# Patient Record
Sex: Female | Born: 1935 | Race: White | Hispanic: No | Marital: Married | Smoking: Never smoker
Health system: Southern US, Community
[De-identification: ages and names within clinical notes are randomized; demographics above are authoritative.]

## PROBLEM LIST (undated history)

## (undated) DIAGNOSIS — R296 Repeated falls: Secondary | ICD-10-CM

## (undated) DIAGNOSIS — G4730 Sleep apnea, unspecified: Secondary | ICD-10-CM

## (undated) DIAGNOSIS — I509 Heart failure, unspecified: Secondary | ICD-10-CM

## (undated) DIAGNOSIS — M810 Age-related osteoporosis without current pathological fracture: Secondary | ICD-10-CM

## (undated) DIAGNOSIS — I10 Essential (primary) hypertension: Secondary | ICD-10-CM

## (undated) DIAGNOSIS — T7840XA Allergy, unspecified, initial encounter: Secondary | ICD-10-CM

## (undated) DIAGNOSIS — E785 Hyperlipidemia, unspecified: Secondary | ICD-10-CM

## (undated) DIAGNOSIS — W19XXXA Unspecified fall, initial encounter: Secondary | ICD-10-CM

## (undated) DIAGNOSIS — E079 Disorder of thyroid, unspecified: Secondary | ICD-10-CM

## (undated) HISTORY — DX: Disorder of thyroid, unspecified: E07.9

## (undated) HISTORY — DX: Repeated falls: R29.6

## (undated) HISTORY — DX: Heart failure, unspecified: I50.9

## (undated) HISTORY — DX: Essential (primary) hypertension: I10

## (undated) HISTORY — DX: Allergy, unspecified, initial encounter: T78.40XA

## (undated) HISTORY — DX: Age-related osteoporosis without current pathological fracture: M81.0

## (undated) HISTORY — PX: JOINT REPLACEMENT: SHX530

## (undated) HISTORY — DX: Unspecified fall, initial encounter: W19.XXXA

## (undated) HISTORY — PX: CATARACT EXTRACTION W/ INTRAOCULAR LENS  IMPLANT, BILATERAL: SHX1307

## (undated) HISTORY — PX: HEMORRHOID SURGERY: SHX153

## (undated) HISTORY — DX: Hyperlipidemia, unspecified: E78.5

## (undated) HISTORY — PX: REPLACEMENT TOTAL KNEE: SUR1224

## (undated) HISTORY — PX: CHOLECYSTECTOMY: SHX55

## (undated) HISTORY — PX: APPENDECTOMY: SHX54

## (undated) HISTORY — DX: Sleep apnea, unspecified: G47.30

---

## 2004-01-15 ENCOUNTER — Ambulatory Visit: Payer: Self-pay | Admitting: Pain Medicine

## 2004-01-21 ENCOUNTER — Ambulatory Visit: Payer: Self-pay | Admitting: Pain Medicine

## 2004-02-28 ENCOUNTER — Ambulatory Visit: Payer: Self-pay | Admitting: Pain Medicine

## 2004-03-10 ENCOUNTER — Ambulatory Visit: Payer: Self-pay | Admitting: Pain Medicine

## 2004-04-10 ENCOUNTER — Ambulatory Visit: Payer: Self-pay | Admitting: Pain Medicine

## 2004-04-21 ENCOUNTER — Ambulatory Visit: Payer: Self-pay | Admitting: Pain Medicine

## 2004-05-27 ENCOUNTER — Ambulatory Visit: Payer: Self-pay | Admitting: Pain Medicine

## 2004-06-02 ENCOUNTER — Ambulatory Visit: Payer: Self-pay | Admitting: Pain Medicine

## 2004-07-01 ENCOUNTER — Ambulatory Visit: Payer: Self-pay | Admitting: Pain Medicine

## 2004-07-16 ENCOUNTER — Ambulatory Visit: Payer: Self-pay | Admitting: Pain Medicine

## 2004-08-26 ENCOUNTER — Ambulatory Visit: Payer: Self-pay | Admitting: Pain Medicine

## 2004-09-08 ENCOUNTER — Ambulatory Visit: Payer: Self-pay | Admitting: Pain Medicine

## 2004-10-21 ENCOUNTER — Ambulatory Visit: Payer: Self-pay | Admitting: Pain Medicine

## 2004-11-11 ENCOUNTER — Ambulatory Visit: Payer: Self-pay | Admitting: Pain Medicine

## 2005-01-05 ENCOUNTER — Ambulatory Visit: Payer: Self-pay | Admitting: Internal Medicine

## 2006-02-09 ENCOUNTER — Ambulatory Visit: Payer: Self-pay | Admitting: Internal Medicine

## 2006-03-04 ENCOUNTER — Ambulatory Visit: Payer: Self-pay | Admitting: Pain Medicine

## 2006-03-08 ENCOUNTER — Ambulatory Visit: Payer: Self-pay | Admitting: Pain Medicine

## 2006-04-22 ENCOUNTER — Ambulatory Visit: Payer: Self-pay | Admitting: Pain Medicine

## 2006-05-03 ENCOUNTER — Ambulatory Visit: Payer: Self-pay | Admitting: Pain Medicine

## 2006-06-09 ENCOUNTER — Ambulatory Visit: Payer: Self-pay | Admitting: Internal Medicine

## 2006-06-15 ENCOUNTER — Ambulatory Visit: Payer: Self-pay | Admitting: Pain Medicine

## 2006-06-21 ENCOUNTER — Ambulatory Visit: Payer: Self-pay | Admitting: Pain Medicine

## 2006-07-13 ENCOUNTER — Ambulatory Visit: Payer: Self-pay | Admitting: Pain Medicine

## 2006-07-14 ENCOUNTER — Ambulatory Visit: Payer: Self-pay | Admitting: Specialist

## 2006-07-19 ENCOUNTER — Ambulatory Visit: Payer: Self-pay | Admitting: Pain Medicine

## 2006-08-10 ENCOUNTER — Ambulatory Visit: Payer: Self-pay | Admitting: Pain Medicine

## 2006-08-11 ENCOUNTER — Ambulatory Visit: Payer: Self-pay | Admitting: Specialist

## 2006-09-07 ENCOUNTER — Ambulatory Visit: Payer: Self-pay | Admitting: Pain Medicine

## 2006-09-13 ENCOUNTER — Ambulatory Visit: Payer: Self-pay | Admitting: Pain Medicine

## 2006-10-04 ENCOUNTER — Ambulatory Visit: Payer: Self-pay | Admitting: Pain Medicine

## 2006-10-12 ENCOUNTER — Ambulatory Visit: Payer: Self-pay | Admitting: Pain Medicine

## 2006-10-18 ENCOUNTER — Ambulatory Visit: Payer: Self-pay | Admitting: Pain Medicine

## 2006-11-09 ENCOUNTER — Ambulatory Visit: Payer: Self-pay | Admitting: Pain Medicine

## 2006-11-15 ENCOUNTER — Ambulatory Visit: Payer: Self-pay | Admitting: Pain Medicine

## 2006-12-14 ENCOUNTER — Ambulatory Visit: Payer: Self-pay | Admitting: Pain Medicine

## 2007-01-25 ENCOUNTER — Ambulatory Visit: Payer: Self-pay | Admitting: Pain Medicine

## 2007-02-02 ENCOUNTER — Ambulatory Visit: Payer: Self-pay | Admitting: Pain Medicine

## 2007-02-07 ENCOUNTER — Ambulatory Visit: Payer: Self-pay | Admitting: Pain Medicine

## 2007-05-03 ENCOUNTER — Ambulatory Visit: Payer: Self-pay | Admitting: Gastroenterology

## 2007-07-27 ENCOUNTER — Ambulatory Visit: Payer: Self-pay | Admitting: Pain Medicine

## 2007-08-30 ENCOUNTER — Ambulatory Visit: Payer: Self-pay | Admitting: Pain Medicine

## 2007-10-11 ENCOUNTER — Ambulatory Visit: Payer: Self-pay | Admitting: Pain Medicine

## 2007-11-08 ENCOUNTER — Ambulatory Visit: Payer: Self-pay | Admitting: Pain Medicine

## 2007-12-06 ENCOUNTER — Ambulatory Visit: Payer: Self-pay | Admitting: Pain Medicine

## 2008-01-10 ENCOUNTER — Ambulatory Visit: Payer: Self-pay | Admitting: Pain Medicine

## 2008-02-21 ENCOUNTER — Ambulatory Visit: Payer: Self-pay | Admitting: Pain Medicine

## 2008-02-27 ENCOUNTER — Ambulatory Visit: Payer: Self-pay | Admitting: Pain Medicine

## 2008-03-20 ENCOUNTER — Ambulatory Visit: Payer: Self-pay | Admitting: Pain Medicine

## 2008-03-28 ENCOUNTER — Ambulatory Visit: Payer: Self-pay | Admitting: Pain Medicine

## 2008-04-10 ENCOUNTER — Ambulatory Visit: Payer: Self-pay | Admitting: Pain Medicine

## 2008-05-08 ENCOUNTER — Ambulatory Visit: Payer: Self-pay | Admitting: Pain Medicine

## 2008-05-14 ENCOUNTER — Ambulatory Visit: Payer: Self-pay | Admitting: Pain Medicine

## 2008-06-05 ENCOUNTER — Ambulatory Visit: Payer: Self-pay | Admitting: Pain Medicine

## 2008-06-11 ENCOUNTER — Ambulatory Visit: Payer: Self-pay | Admitting: Pain Medicine

## 2008-06-21 ENCOUNTER — Ambulatory Visit: Payer: Self-pay

## 2008-07-05 ENCOUNTER — Ambulatory Visit: Payer: Self-pay | Admitting: Pain Medicine

## 2008-07-30 ENCOUNTER — Ambulatory Visit: Payer: Self-pay | Admitting: Pain Medicine

## 2008-08-15 ENCOUNTER — Encounter: Payer: Self-pay | Admitting: Pain Medicine

## 2008-09-04 ENCOUNTER — Ambulatory Visit: Payer: Self-pay | Admitting: Pain Medicine

## 2008-10-02 ENCOUNTER — Ambulatory Visit: Payer: Self-pay | Admitting: Pain Medicine

## 2008-10-24 ENCOUNTER — Ambulatory Visit: Payer: Self-pay | Admitting: Pain Medicine

## 2008-11-22 ENCOUNTER — Ambulatory Visit: Payer: Self-pay | Admitting: Pain Medicine

## 2008-12-19 ENCOUNTER — Ambulatory Visit: Payer: Self-pay | Admitting: Ophthalmology

## 2008-12-25 ENCOUNTER — Ambulatory Visit: Payer: Self-pay | Admitting: Pain Medicine

## 2009-01-01 ENCOUNTER — Ambulatory Visit: Payer: Self-pay | Admitting: Ophthalmology

## 2009-01-29 ENCOUNTER — Ambulatory Visit: Payer: Self-pay | Admitting: Pain Medicine

## 2009-01-29 ENCOUNTER — Ambulatory Visit: Payer: Self-pay | Admitting: Ophthalmology

## 2009-02-12 ENCOUNTER — Ambulatory Visit: Payer: Self-pay | Admitting: Ophthalmology

## 2009-02-28 ENCOUNTER — Ambulatory Visit: Payer: Self-pay | Admitting: Pain Medicine

## 2009-03-14 ENCOUNTER — Ambulatory Visit: Payer: Self-pay | Admitting: Pain Medicine

## 2009-03-28 ENCOUNTER — Ambulatory Visit: Payer: Self-pay | Admitting: Pain Medicine

## 2009-04-25 ENCOUNTER — Ambulatory Visit: Payer: Self-pay | Admitting: Pain Medicine

## 2009-05-23 ENCOUNTER — Ambulatory Visit: Payer: Self-pay | Admitting: Pain Medicine

## 2009-06-20 ENCOUNTER — Ambulatory Visit: Payer: Self-pay | Admitting: Pain Medicine

## 2009-07-16 ENCOUNTER — Ambulatory Visit: Payer: Self-pay | Admitting: Unknown Physician Specialty

## 2009-07-18 ENCOUNTER — Ambulatory Visit: Payer: Self-pay | Admitting: Pain Medicine

## 2009-07-23 ENCOUNTER — Inpatient Hospital Stay: Payer: Self-pay | Admitting: Unknown Physician Specialty

## 2009-07-27 ENCOUNTER — Encounter: Payer: Self-pay | Admitting: Internal Medicine

## 2009-09-03 ENCOUNTER — Inpatient Hospital Stay: Payer: Self-pay | Admitting: Internal Medicine

## 2009-09-30 ENCOUNTER — Ambulatory Visit: Payer: Self-pay | Admitting: Internal Medicine

## 2009-10-08 ENCOUNTER — Ambulatory Visit: Payer: Self-pay | Admitting: Pain Medicine

## 2009-10-14 ENCOUNTER — Ambulatory Visit: Payer: Self-pay | Admitting: Pain Medicine

## 2009-11-07 ENCOUNTER — Ambulatory Visit: Payer: Self-pay | Admitting: Pain Medicine

## 2009-11-11 ENCOUNTER — Ambulatory Visit: Payer: Self-pay | Admitting: Pain Medicine

## 2009-12-03 ENCOUNTER — Ambulatory Visit: Payer: Self-pay | Admitting: Pain Medicine

## 2010-01-07 ENCOUNTER — Ambulatory Visit: Payer: Self-pay | Admitting: Pain Medicine

## 2010-02-04 ENCOUNTER — Ambulatory Visit: Payer: Self-pay | Admitting: Pain Medicine

## 2010-03-04 ENCOUNTER — Ambulatory Visit: Payer: Self-pay | Admitting: Pain Medicine

## 2010-03-10 ENCOUNTER — Ambulatory Visit: Payer: Self-pay | Admitting: Pain Medicine

## 2010-04-09 ENCOUNTER — Ambulatory Visit: Payer: Self-pay | Admitting: Pain Medicine

## 2010-05-20 ENCOUNTER — Ambulatory Visit: Payer: Self-pay | Admitting: Pain Medicine

## 2010-06-04 ENCOUNTER — Ambulatory Visit: Payer: Self-pay | Admitting: Pain Medicine

## 2010-07-08 ENCOUNTER — Ambulatory Visit: Payer: Self-pay | Admitting: Pain Medicine

## 2010-07-30 ENCOUNTER — Ambulatory Visit: Payer: Self-pay | Admitting: Pain Medicine

## 2010-09-02 ENCOUNTER — Ambulatory Visit: Payer: Self-pay | Admitting: Pain Medicine

## 2010-09-30 ENCOUNTER — Ambulatory Visit: Payer: Self-pay | Admitting: Pain Medicine

## 2010-10-01 ENCOUNTER — Ambulatory Visit: Payer: Self-pay | Admitting: Pain Medicine

## 2010-11-04 ENCOUNTER — Ambulatory Visit: Payer: Self-pay | Admitting: Pain Medicine

## 2010-12-02 ENCOUNTER — Ambulatory Visit: Payer: Self-pay | Admitting: Pain Medicine

## 2010-12-30 ENCOUNTER — Ambulatory Visit: Payer: Self-pay | Admitting: Pain Medicine

## 2011-01-27 ENCOUNTER — Ambulatory Visit: Payer: Self-pay | Admitting: Pain Medicine

## 2011-03-03 ENCOUNTER — Ambulatory Visit: Payer: Self-pay | Admitting: Pain Medicine

## 2011-03-09 ENCOUNTER — Ambulatory Visit: Payer: Self-pay | Admitting: Pain Medicine

## 2011-04-01 ENCOUNTER — Ambulatory Visit: Payer: Self-pay | Admitting: Pain Medicine

## 2011-05-11 ENCOUNTER — Ambulatory Visit: Payer: Self-pay | Admitting: Pain Medicine

## 2011-05-13 ENCOUNTER — Ambulatory Visit: Payer: Self-pay | Admitting: Pain Medicine

## 2011-06-09 ENCOUNTER — Ambulatory Visit: Payer: Self-pay | Admitting: Pain Medicine

## 2011-06-17 ENCOUNTER — Ambulatory Visit: Payer: Self-pay | Admitting: Pain Medicine

## 2011-07-08 ENCOUNTER — Ambulatory Visit: Payer: Self-pay | Admitting: Pain Medicine

## 2011-08-11 ENCOUNTER — Ambulatory Visit: Payer: Self-pay | Admitting: Pain Medicine

## 2011-09-08 ENCOUNTER — Ambulatory Visit: Payer: Self-pay | Admitting: Pain Medicine

## 2011-10-13 ENCOUNTER — Ambulatory Visit: Payer: Self-pay | Admitting: Pain Medicine

## 2011-10-21 ENCOUNTER — Ambulatory Visit: Payer: Self-pay | Admitting: Pain Medicine

## 2011-11-10 ENCOUNTER — Ambulatory Visit: Payer: Self-pay | Admitting: Pain Medicine

## 2011-12-08 ENCOUNTER — Ambulatory Visit: Payer: Self-pay | Admitting: Cardiology

## 2011-12-10 ENCOUNTER — Ambulatory Visit: Payer: Self-pay | Admitting: Pain Medicine

## 2011-12-23 ENCOUNTER — Emergency Department: Payer: Self-pay | Admitting: Emergency Medicine

## 2011-12-23 LAB — CBC
HGB: 12.3 g/dL (ref 12.0–16.0)
MCH: 31.2 pg (ref 26.0–34.0)
MCHC: 34.8 g/dL (ref 32.0–36.0)
MCV: 90 fL (ref 80–100)
RBC: 3.94 10*6/uL (ref 3.80–5.20)
RDW: 13.9 % (ref 11.5–14.5)

## 2011-12-23 LAB — BASIC METABOLIC PANEL
Anion Gap: 10 (ref 7–16)
Calcium, Total: 8.9 mg/dL (ref 8.5–10.1)
Co2: 26 mmol/L (ref 21–32)
EGFR (Non-African Amer.): 39 — ABNORMAL LOW
Glucose: 112 mg/dL — ABNORMAL HIGH (ref 65–99)
Potassium: 3.8 mmol/L (ref 3.5–5.1)
Sodium: 143 mmol/L (ref 136–145)

## 2011-12-24 LAB — URINALYSIS, COMPLETE
Blood: NEGATIVE
Leukocyte Esterase: NEGATIVE
Nitrite: NEGATIVE
Ph: 6 (ref 4.5–8.0)
Squamous Epithelial: 10

## 2012-01-12 ENCOUNTER — Ambulatory Visit: Payer: Self-pay | Admitting: Pain Medicine

## 2012-01-20 ENCOUNTER — Ambulatory Visit: Payer: Self-pay | Admitting: Pain Medicine

## 2012-03-29 ENCOUNTER — Ambulatory Visit: Payer: Self-pay | Admitting: Pain Medicine

## 2012-05-03 ENCOUNTER — Ambulatory Visit: Payer: Self-pay | Admitting: Pain Medicine

## 2012-05-11 ENCOUNTER — Ambulatory Visit: Payer: Self-pay | Admitting: Pain Medicine

## 2012-05-31 ENCOUNTER — Ambulatory Visit: Payer: Self-pay | Admitting: Pain Medicine

## 2012-06-13 ENCOUNTER — Ambulatory Visit: Payer: Self-pay | Admitting: Pain Medicine

## 2012-06-28 ENCOUNTER — Ambulatory Visit: Payer: Self-pay | Admitting: Pain Medicine

## 2012-07-13 ENCOUNTER — Ambulatory Visit: Payer: Self-pay | Admitting: Pain Medicine

## 2012-08-09 ENCOUNTER — Ambulatory Visit: Payer: Self-pay | Admitting: Pain Medicine

## 2012-08-29 ENCOUNTER — Ambulatory Visit: Payer: Self-pay | Admitting: Pain Medicine

## 2012-09-06 ENCOUNTER — Ambulatory Visit: Payer: Self-pay | Admitting: Pain Medicine

## 2012-10-04 ENCOUNTER — Ambulatory Visit: Payer: Self-pay | Admitting: Pain Medicine

## 2012-11-01 ENCOUNTER — Ambulatory Visit: Payer: Self-pay | Admitting: Pain Medicine

## 2012-12-06 ENCOUNTER — Ambulatory Visit: Payer: Self-pay | Admitting: Pain Medicine

## 2013-01-03 ENCOUNTER — Ambulatory Visit: Payer: Self-pay | Admitting: Pain Medicine

## 2013-01-31 ENCOUNTER — Ambulatory Visit: Payer: Self-pay | Admitting: Pain Medicine

## 2013-03-07 ENCOUNTER — Ambulatory Visit: Payer: Self-pay | Admitting: Pain Medicine

## 2013-04-04 ENCOUNTER — Ambulatory Visit: Payer: Self-pay | Admitting: Pain Medicine

## 2013-05-02 ENCOUNTER — Ambulatory Visit: Payer: Self-pay | Admitting: Pain Medicine

## 2013-06-06 ENCOUNTER — Ambulatory Visit: Payer: Self-pay | Admitting: Pain Medicine

## 2013-07-04 ENCOUNTER — Ambulatory Visit: Payer: Self-pay | Admitting: Pain Medicine

## 2013-08-08 ENCOUNTER — Ambulatory Visit: Payer: Self-pay | Admitting: Pain Medicine

## 2013-09-12 ENCOUNTER — Ambulatory Visit: Payer: Self-pay | Admitting: Pain Medicine

## 2013-10-10 ENCOUNTER — Ambulatory Visit: Payer: Self-pay | Admitting: Pain Medicine

## 2013-11-07 ENCOUNTER — Ambulatory Visit: Payer: Self-pay | Admitting: Pain Medicine

## 2013-12-05 ENCOUNTER — Ambulatory Visit: Payer: Self-pay | Admitting: Pain Medicine

## 2014-01-02 ENCOUNTER — Ambulatory Visit: Payer: Self-pay | Admitting: Pain Medicine

## 2014-02-06 ENCOUNTER — Ambulatory Visit: Payer: Self-pay | Admitting: Pain Medicine

## 2014-03-06 ENCOUNTER — Ambulatory Visit: Payer: Self-pay | Admitting: Pain Medicine

## 2014-04-03 ENCOUNTER — Ambulatory Visit: Payer: Self-pay | Admitting: Pain Medicine

## 2014-05-08 ENCOUNTER — Ambulatory Visit: Payer: Self-pay | Admitting: Pain Medicine

## 2014-06-05 ENCOUNTER — Ambulatory Visit: Payer: Self-pay | Admitting: Pain Medicine

## 2014-07-10 ENCOUNTER — Ambulatory Visit
Admit: 2014-07-10 | Disposition: A | Discharge status: Home / Self Care | Payer: Self-pay | Attending: Pain Medicine | Admitting: Pain Medicine

## 2014-07-10 ENCOUNTER — Ambulatory Visit
Admit: 2014-07-10 | Disposition: A | Discharge status: Home / Self Care | Payer: Self-pay | Attending: Internal Medicine | Admitting: Internal Medicine

## 2014-08-07 ENCOUNTER — Ambulatory Visit: Payer: Medicare Other | Attending: Pain Medicine | Admitting: Pain Medicine

## 2014-08-07 ENCOUNTER — Encounter (INDEPENDENT_AMBULATORY_CARE_PROVIDER_SITE_OTHER): Payer: Self-pay

## 2014-08-07 ENCOUNTER — Encounter: Payer: Self-pay | Admitting: Pain Medicine

## 2014-08-07 VITALS — BP 151/69 | HR 71 | Temp 98.6°F | Resp 16 | Ht 61.0 in | Wt 213.0 lb

## 2014-08-07 DIAGNOSIS — M5030 Other cervical disc degeneration, unspecified cervical region: Secondary | ICD-10-CM

## 2014-08-07 DIAGNOSIS — M5136 Other intervertebral disc degeneration, lumbar region: Secondary | ICD-10-CM | POA: Diagnosis not present

## 2014-08-07 DIAGNOSIS — M25512 Pain in left shoulder: Secondary | ICD-10-CM | POA: Diagnosis present

## 2014-08-07 DIAGNOSIS — I8390 Asymptomatic varicose veins of unspecified lower extremity: Secondary | ICD-10-CM | POA: Insufficient documentation

## 2014-08-07 DIAGNOSIS — G5600 Carpal tunnel syndrome, unspecified upper limb: Secondary | ICD-10-CM | POA: Insufficient documentation

## 2014-08-07 DIAGNOSIS — M7061 Trochanteric bursitis, right hip: Secondary | ICD-10-CM

## 2014-08-07 DIAGNOSIS — G5603 Carpal tunnel syndrome, bilateral upper limbs: Secondary | ICD-10-CM

## 2014-08-07 DIAGNOSIS — M5126 Other intervertebral disc displacement, lumbar region: Secondary | ICD-10-CM | POA: Diagnosis not present

## 2014-08-07 DIAGNOSIS — M25511 Pain in right shoulder: Secondary | ICD-10-CM | POA: Diagnosis present

## 2014-08-07 DIAGNOSIS — M7060 Trochanteric bursitis, unspecified hip: Secondary | ICD-10-CM | POA: Insufficient documentation

## 2014-08-07 DIAGNOSIS — M7062 Trochanteric bursitis, left hip: Secondary | ICD-10-CM

## 2014-08-07 DIAGNOSIS — M170 Bilateral primary osteoarthritis of knee: Secondary | ICD-10-CM

## 2014-08-07 DIAGNOSIS — M5481 Occipital neuralgia: Secondary | ICD-10-CM | POA: Diagnosis not present

## 2014-08-07 DIAGNOSIS — M4806 Spinal stenosis, lumbar region: Secondary | ICD-10-CM | POA: Insufficient documentation

## 2014-08-07 MED ORDER — TRAMADOL HCL 50 MG PO TABS: ORAL_TABLET | ORAL | Status: DC

## 2014-08-07 MED ORDER — HYDROCODONE-ACETAMINOPHEN 5-325 MG PO TABS: ORAL_TABLET | ORAL | Status: DC

## 2014-08-07 NOTE — Progress Notes (Signed)
   Subjective:    Patient ID: Marisa SaversGrace R Randal, female    DOB: Nov 07, 1923, 81 y.o.   MRN: [id number]  HPI    Review of Systems     Objective:   Physical Exam        Assessment & Plan:

## 2014-08-07 NOTE — Progress Notes (Signed)
Discharged at 1315, ambulatory.

## 2014-08-07 NOTE — Patient Instructions (Signed)
Continue present medications. Tramadol and hydrocodone acetaminophen  F/U PCP for evaliation of  BP and general medical  condition.  F/U surgical evaluation.  F/U neurological evaluation.  May consider radiofrequency rhizolysis or intraspinal procedures pending response to present treatment and F/U evaluation.  [facility] to call [facility] should patient have concerns prior to scheduled return appointment.

## 2014-08-07 NOTE — Progress Notes (Signed)
   Subjective:    Patient ID: Marisa [name], female    DOB: January 23, 1924, 81 y.o.   MRN: [id number]  HPI  Patient is a 81 year old female [facility] for further evaluation and treatment of pain involving multiple regions patient is to pain involving the shoulders Marisa [name] the right shoulder lower back lower extremity regions as well. Stressed interventional treatment which we will avoid and will continue medications as discussed with patient. We will consider modification of treatment pending follow-up evaluation patient denies trauma change in events of daily living of significant degree. We attributed the increased pain of multiple regions to the cold damp weather at this time.    Review of Systems     Objective:   Physical Exam  There was tenderness of the splenius capitis muscles and is occipitalis muscles of moderate degree and palpation of the acromioclavicular glenohumeral joint regions reproduced mild discomfort grip strength decreased on the left compared to the right with Tinel and Phalen's maneuver increasing pain on the left greater than the right. Patient of the thoracic facet thoracic paraspinal musculature region with tinged palpation with moderate muscle spasms without crepitus of the thoracic region noted. Region of the lumbar paraspinal muscle lumbar facet region reproduces moderately severe discomfort left greater than right with severe tenderness of the gluteal and piriformis muscles on the left greater than the right. Straight leg raising tolerates approximately 20 without increased pain with dorsiflexion noted. Superficial varicosities of the lower extremities noted without significant lower extremity edema. There was negative clonus negative Homans. Moderate tenderness of the greater trochanteric region. Abdomen nontender and no costovertebral angle tenderness noted    Assessment & Plan:  Degenerative disc disease lumbar spine L1-2 disc bulging with  multilevel spinal canal stenosis noted at L3-4 L4-5 most significantly as well as L1-2 and L2-3 of lesser degree  . Greater trochanteric bursitis  Carpal tunnel syndrome Prior fracture of right wrist  Varicose veins  Occipital neuralgia    Plan  Continue present medications tramadol and hydrocodone acetaminophen.  F/U PCP for evaliation of  BP and general medical  condition.  F/U surgical evaluation.  F/U neurological evaluation.  May consider radiofrequency rhizolysis or intraspinal procedures pending response to present treatment and F/U evaluation.  Patient to call [facility] should patient have concerns prior to scheduled return appointment.

## 2014-08-14 ENCOUNTER — Other Ambulatory Visit: Payer: Self-pay | Admitting: Pain Medicine

## 2014-09-03 ENCOUNTER — Ambulatory Visit: Payer: Medicare Other | Attending: Pain Medicine | Admitting: Pain Medicine

## 2014-09-03 ENCOUNTER — Encounter: Payer: Self-pay | Admitting: Pain Medicine

## 2014-09-03 VITALS — BP 184/61 | HR 72 | Temp 98.1°F | Resp 16 | Ht 61.0 in | Wt 226.0 lb

## 2014-09-03 DIAGNOSIS — M170 Bilateral primary osteoarthritis of knee: Secondary | ICD-10-CM | POA: Diagnosis not present

## 2014-09-03 DIAGNOSIS — M4806 Spinal stenosis, lumbar region: Secondary | ICD-10-CM | POA: Insufficient documentation

## 2014-09-03 DIAGNOSIS — M545 Low back pain: Secondary | ICD-10-CM | POA: Diagnosis present

## 2014-09-03 DIAGNOSIS — M51369 Other intervertebral disc degeneration, lumbar region without mention of lumbar back pain or lower extremity pain: Secondary | ICD-10-CM

## 2014-09-03 DIAGNOSIS — M5136 Other intervertebral disc degeneration, lumbar region: Secondary | ICD-10-CM | POA: Diagnosis not present

## 2014-09-03 DIAGNOSIS — G5600 Carpal tunnel syndrome, unspecified upper limb: Secondary | ICD-10-CM | POA: Insufficient documentation

## 2014-09-03 DIAGNOSIS — M48062 Spinal stenosis, lumbar region with neurogenic claudication: Secondary | ICD-10-CM | POA: Insufficient documentation

## 2014-09-03 DIAGNOSIS — M7060 Trochanteric bursitis, unspecified hip: Secondary | ICD-10-CM | POA: Diagnosis not present

## 2014-09-03 DIAGNOSIS — M533 Sacrococcygeal disorders, not elsewhere classified: Secondary | ICD-10-CM | POA: Diagnosis not present

## 2014-09-03 DIAGNOSIS — M47816 Spondylosis without myelopathy or radiculopathy, lumbar region: Secondary | ICD-10-CM | POA: Insufficient documentation

## 2014-09-03 DIAGNOSIS — G5603 Carpal tunnel syndrome, bilateral upper limbs: Secondary | ICD-10-CM

## 2014-09-03 MED ORDER — HYDROCODONE-ACETAMINOPHEN 5-325 MG PO TABS: ORAL_TABLET | ORAL | Status: DC

## 2014-09-03 MED ORDER — TRAMADOL HCL 50 MG PO TABS: ORAL_TABLET | ORAL | Status: DC

## 2014-09-03 NOTE — Progress Notes (Signed)
Safety precautions to be maintained throughout the outpatient stay will include: orient to surroundings, keep bed in low position, maintain call bell within reach at all times, provide assistance with transfer out of bed and ambulation.  

## 2014-09-03 NOTE — Patient Instructions (Signed)
Continue present medications.  F/U PCP for evaliation of  BP and general medical  condition.  F/U surgical evaluation.  F/U neurological evaluation.  May consider radiofrequency rhizolysis or intraspinal procedures pending response to present treatment and F/U evaluation.  Patient to call [facility] should patient have concerns prior to scheduled return appointment.  

## 2014-09-03 NOTE — Progress Notes (Signed)
   Subjective:    Patient ID: Marisa [name], female    DOB: 1923/06/01, 81 y.o.   MRN: [id number]  HPI   patient is 81 year old female [facility] for further evaluation and treatment of pain involving the lower back lower extremity region. Patient states that her pain is aggravated by prolonged standing and walking. Patient states that the legs become weaker with longer periods of standing. Patient is without trauma or change in events of daily living the cause change in symptomatology. Patient also has pain involving the left upper extremity with prior fracture of the left risk. Patient states that there is also pain with reaching lifting pushing pulling maneuvers. We discussed patient's condition with patient and relative was present today and decision has been made to continue medications as prescribed at this time. We will avoid interventional treatment since there is concern regarding steroid effect on patient's general medical condition. Patient is status post surgical evaluation at [facility] with recommendation to continue treatment and [facility] as presently doing. We'll continue present medications and avoid interventional treatment. All are understanding and agree with suggested treatment plan    Review of Systems     Objective:   Physical Exam  there was tenderness over the splenius capitis and occipitalis regions of mild degree. Palpation over the acromioclavicular and glenohumeral joint regions reproduced minimal discomfort. Outpatient of the cervical and thoracic paraspinal musculature regions reproduced mild discomfort. There was no crepitus of the thoracic region noted. Palpation over the lower thoracic paraspinal musculature region was a tends to palpation of moderate degree. Palpation over the lumbar paraspinal musculature region lumbar facet region reproduced mild moderate discomfort. With severe tenderness over the gluteal  and piriformis musculature regions. Palpation of the PSIS and PII S regions reproduced moderate discomfort. There was superficial varicosities of the lower extremities noted with negative clonus negative Homans Palpation of the knees reproduced pain of moderate degree without increased warmth or erythema noted in the region of the knees abdomen was nontender and no costovertebral tenderness was noted         Assessment & Plan:     degenerative disc disease lumbar spine multilevel spinal canal stenosis L3-4 L4-5 L1-2 and L2-3  facet arthrosis , neural foraminal narrowing   lumbar stenosis with neurogenic claudication   Trochanteric bursitis   Degenerative joint disease of knees   Sacroiliac joint dysfunction   Carpal tunnel syndrome      plan   Continue present medications. Tramadol and hydrocodone acetaminophen  F/U PCP for evaliation of  BP and general medical  condition.  F/U surgical evaluation.  F/U neurological evaluation.  May consider radiofrequency rhizolysis or intraspinal procedures pending response to present treatment and F/U evaluation.  Patient to call [facility] should patient have concerns prior to scheduled return appointment.

## 2014-09-03 NOTE — Progress Notes (Signed)
Discharged with walker at 1330 Scripts given for hydrocodone and tramadol

## 2014-10-02 ENCOUNTER — Ambulatory Visit: Payer: Medicare Other | Attending: Pain Medicine | Admitting: Pain Medicine

## 2014-10-02 ENCOUNTER — Encounter: Payer: Self-pay | Admitting: Pain Medicine

## 2014-10-02 VITALS — BP 185/72 | HR 70 | Temp 98.0°F | Resp 18 | Ht 61.0 in | Wt 212.0 lb

## 2014-10-02 DIAGNOSIS — M47816 Spondylosis without myelopathy or radiculopathy, lumbar region: Secondary | ICD-10-CM | POA: Diagnosis not present

## 2014-10-02 DIAGNOSIS — M79601 Pain in right arm: Secondary | ICD-10-CM | POA: Diagnosis present

## 2014-10-02 DIAGNOSIS — M4806 Spinal stenosis, lumbar region: Secondary | ICD-10-CM | POA: Diagnosis not present

## 2014-10-02 DIAGNOSIS — M170 Bilateral primary osteoarthritis of knee: Secondary | ICD-10-CM | POA: Insufficient documentation

## 2014-10-02 DIAGNOSIS — M7060 Trochanteric bursitis, unspecified hip: Secondary | ICD-10-CM | POA: Diagnosis not present

## 2014-10-02 DIAGNOSIS — G5600 Carpal tunnel syndrome, unspecified upper limb: Secondary | ICD-10-CM

## 2014-10-02 DIAGNOSIS — M48062 Spinal stenosis, lumbar region with neurogenic claudication: Secondary | ICD-10-CM

## 2014-10-02 DIAGNOSIS — M5136 Other intervertebral disc degeneration, lumbar region: Secondary | ICD-10-CM | POA: Insufficient documentation

## 2014-10-02 DIAGNOSIS — M542 Cervicalgia: Secondary | ICD-10-CM | POA: Diagnosis present

## 2014-10-02 DIAGNOSIS — M79602 Pain in left arm: Secondary | ICD-10-CM | POA: Diagnosis present

## 2014-10-02 DIAGNOSIS — M533 Sacrococcygeal disorders, not elsewhere classified: Secondary | ICD-10-CM

## 2014-10-02 DIAGNOSIS — M51369 Other intervertebral disc degeneration, lumbar region without mention of lumbar back pain or lower extremity pain: Secondary | ICD-10-CM

## 2014-10-02 MED ORDER — HYDROCODONE-ACETAMINOPHEN 5-325 MG PO TABS: ORAL_TABLET | ORAL | Status: DC

## 2014-10-02 MED ORDER — TRAMADOL HCL 50 MG PO TABS: ORAL_TABLET | ORAL | Status: DC

## 2014-10-02 NOTE — Progress Notes (Signed)
   Subjective:    Patient ID: Marisa [name], female    DOB: 04-08-1923, 81 y.o.   MRN: [id number]  HPI  Patient is a 89-year-old female returns to [facility] for further evaluation and treatment of pain involving neck upper extremity region as well as the upper mid and lower back and lower extremity regions. Patient without any trauma change in events of daily living the cause change in symptomatology. Patient states that her pain which occurs with lateral bending and rotation appears to last longer now. We discussed patient's overall condition and will continue to avoid interventional treatment. We discussed patient's medication patient and patient was accompanied by her son on today's visit. We carefully informed patient that she should avoid taking medication to the extent where it may cause some drowsiness confusion or other undesirable side effects. The patient as well as her son acknowledged these warnings and agreed to make all attempts to avoid such undesirable side effects. We will continue tramadol and hydrocodone acetaminophen as discussed and avoid interventional treatment at this time. All understanding and agreement status treatment plan.  Review of Systems     Objective:   Physical Exam  There was tenderness to palpation of the regions please And occipitalis musculature region of mild degree. There was unremarkable Spurling's maneuver. Mild tinnitus of the acromioclavicular glenohumeral joint regions. Patient appeared to be with bilaterally equal grip strength. Tinel and Phalen's maneuver were without increase of pain of significant degree. Palpation over the thoracic facet thoracic paraspinal musculature region with tinged palpation of mild to moderate degree. With no crepitus of the thoracic region noted. Palpation over the region of the lumbar paraspinal musculature region lumbar facet region associated with moderate discomfort with lateral bending and rotation extension  and palpation of the lumbar facets reproducing moderate discomfort. Straight leg raising tolerates approximately 20 without increased pain dorsiflexion noted. Superficial varicosities of the lower extremities noted. Negative clonus negative Homans. There was no significant lower extremity edema noted. EHL strength appeared to be decreased. No sensory deficit of dermatomal distribution detected. Abdomen protuberant without excessive tends to palpation and no costovertebral maintenance noted.      Assessment & Plan:  Degenerative disc disease lumbar spine Multilevel degenerative changes lumbar spine lumbar stenosis multiple levels L3-4 L4-5 L1-2 and L2-3. Lumbar facet degenerative changes, foraminal narrowing, ligamentum flavum hypertrophy  Lumbar stenosis with with neurogenic claudication  Lumbar facet syndrome  Greater trochanteric bursitis  Degenerative joint disease of knees  Carpal tunnel syndrome   Plan   Continue present medications tramadol and hydrocodone acetaminophen  F/U PCP Dr. Yates DecampJohn Walker III for evaliation of  BP and general medical  condition.  F/U surgical evaluation  F/U neurological evaluation  May consider radiofrequency rhizolysis or intraspinal procedures pending response to present treatment and F/U evaluation.  Patient to call [facility] should patient have concerns prior to scheduled return appointment.

## 2014-10-02 NOTE — Patient Instructions (Addendum)
Continue present medications tramadol and hydrocodone acetaminophen . Begin  Flector patch applications as discussed. Apply the patch to the painful areas of skin twice a day. You may cut the patch in pieces to apply to different areas twice a day  F/U PCP Dr. Yates DecampJohn Walker [name] for evaliation of  BP and general medical  condition.  F/U surgical evaluation  F/U neurological evaluation  May consider radiofrequency rhizolysis or intraspinal procedures pending response to present treatment and F/U evaluation.  Patient to call [facility] should patient have concerns prior to scheduled return appointment.  You were given a script forHydrocodone and Tramadol today You were given samples of Flector Patch. Apply 2 times per day to painful areas.

## 2014-10-02 NOTE — Progress Notes (Signed)
Safety precautions to be maintained throughout the outpatient stay will include: orient to surroundings, keep bed in low position, maintain call bell within reach at all times, provide assistance with transfer out of bed and ambulation.  

## 2014-10-30 ENCOUNTER — Ambulatory Visit: Payer: Medicare Other | Attending: Pain Medicine | Admitting: Pain Medicine

## 2014-10-30 VITALS — HR 88 | Temp 98.4°F | Resp 16 | Ht 60.0 in | Wt 212.0 lb

## 2014-10-30 DIAGNOSIS — M170 Bilateral primary osteoarthritis of knee: Secondary | ICD-10-CM | POA: Insufficient documentation

## 2014-10-30 DIAGNOSIS — M48062 Spinal stenosis, lumbar region with neurogenic claudication: Secondary | ICD-10-CM

## 2014-10-30 DIAGNOSIS — M7060 Trochanteric bursitis, unspecified hip: Secondary | ICD-10-CM | POA: Diagnosis not present

## 2014-10-30 DIAGNOSIS — M47816 Spondylosis without myelopathy or radiculopathy, lumbar region: Secondary | ICD-10-CM | POA: Insufficient documentation

## 2014-10-30 DIAGNOSIS — M5136 Other intervertebral disc degeneration, lumbar region: Secondary | ICD-10-CM | POA: Diagnosis not present

## 2014-10-30 DIAGNOSIS — G5600 Carpal tunnel syndrome, unspecified upper limb: Secondary | ICD-10-CM | POA: Diagnosis not present

## 2014-10-30 DIAGNOSIS — M79605 Pain in left leg: Secondary | ICD-10-CM | POA: Diagnosis present

## 2014-10-30 DIAGNOSIS — M4806 Spinal stenosis, lumbar region: Secondary | ICD-10-CM | POA: Insufficient documentation

## 2014-10-30 DIAGNOSIS — M79604 Pain in right leg: Secondary | ICD-10-CM | POA: Diagnosis present

## 2014-10-30 DIAGNOSIS — M533 Sacrococcygeal disorders, not elsewhere classified: Secondary | ICD-10-CM

## 2014-10-30 DIAGNOSIS — M545 Low back pain: Secondary | ICD-10-CM | POA: Diagnosis present

## 2014-10-30 MED ORDER — TRAMADOL HCL 50 MG PO TABS: ORAL_TABLET | ORAL | Status: DC

## 2014-10-30 MED ORDER — HYDROCODONE-ACETAMINOPHEN 5-325 MG PO TABS: ORAL_TABLET | ORAL | Status: DC

## 2014-10-30 NOTE — Progress Notes (Signed)
   Subjective:    Patient ID: Marisa [name], female    DOB: 01/19/1924, 81 y.o.   MRN: [id number]  HPI    Review of Systems     Objective:   Physical Exam        Assessment & Plan:   

## 2014-10-30 NOTE — Patient Instructions (Signed)
Continue present medication Ultram and hydrocodone acetaminophen  Radiofrequency rhizolysis of the lumbar facets, medial branch nerves, could be considered however this may be too uncomfortable a position for you to be in to have the procedure. We would prefer to avoid considering this procedure for you.  F/U PCP Dr. Virl Axe  for evaliation of  BP and general medical  condition  F/U surgical evaluationAs needed  F/U neurological evaluation  May consider radiofrequency rhizolysis or intraspinal procedures pending response to present treatment and F/U evaluation   Patient to call [facility] should patient have concerns prior to scheduled return appointmen.

## 2014-10-30 NOTE — Progress Notes (Signed)
   Subjective:    Patient ID: Marisa [name], female    DOB: May 12, 1923, 81 y.o.   MRN: [id number]  HPI  Patient is a 81 year old female who [facility] to [facility] for further evaluation and treatment of pain involving the region of the lower back lower extremity region and upper extremity regions. Patient is status post fracture of the right risk and is with history of degenerative changes of the lumbar spine with spinal stenosis with prior surgical evaluation at [facility] neurosurgery department without recommendations for surgical intervention due to patient's general medical condition. We discussed radiofrequency procedures and will avoid radiofrequency procedures due to patient's general medical condition as well. The patient would have difficult time lying on her stomach bother procedure was being performed therefore at the present time we we will avoid considering patient for radiofrequency rhizolysis of the lumbar facets, medial branch nerves,. We will continue presently prescribed medications and will consider patient for additional modification of treatment pending follow-up evaluation. The patient was understanding and in agreement with suggested treatment plan     Review of Systems     Objective:   Physical Exam  There was mild tenderness of the splenius capitis and occipitalis musculature regions. Palpation over the cervical facet cervical paraspinal musculature region reproduced mild discomfort. There was mild tenderness of the acromioclavicular and glenohumeral joint regions. There was questionable decreased grip strength on the right compared to the left. With mild increase of pain with Tinel and Phalen's maneuver on the right compared to the left. There was unremarkable Spurling's maneuver. Palpation over the region of the thoracic facet thoracic paraspinal musculature region was without excessive tends to palpation. There was moderate tends to  palpation of the lower thoracic paraspinal musculature region. No crepitus of the thoracic region was noted. Palpation over the region of the lumbar paraspinal musculature region lumbar facet region was with moderate moderately severe discomfort. There was moderate to moderately severe tenderness to palpation of the PSIS and PII S regions. There was mild tinnitus of the greater trochanteric region iliotibial band region. There was superficial varicosities of the lower extremities noted with negative clonus negative Homans. EHL strength appeared to be decreased. No definite sensory deficit of dermatomal distribution was detected. There was negative clonus negative Homans.         Assessment & Plan:    Degenerative disc disease lumbar spine Multilevel degenerative changes lumbar spine lumbar stenosis multiple levels L3-4 L4-5 L1-2 and L2-3. Lumbar facet degenerative changes, foraminal narrowing, ligamentum flavum hypertrophy  Lumbar stenosis with with neurogenic claudication  Lumbar facet syndrome  Greater trochanteric bursitis  Degenerative joint disease of knees  Carpal tunnel syndrome    Plan   Continue present medication tramadol and hydrocodone acetaminophen  F/U PCP Dr. [name] III for evaliation of  BP and general medical  condition  F/U surgical evaluation as needed  F/U neurological evaluation  May consider radiofrequency rhizolysis or intraspinal procedures pending response to present treatment and F/U evaluation   Patient to call [facility] should patient have concerns prior to scheduled return appointmen.

## 2014-10-30 NOTE — Progress Notes (Signed)
Safety precautions to be maintained throughout the outpatient stay will include: orient to surroundings, keep bed in low position, maintain call bell within reach at all times, provide assistance with transfer out of bed and ambulation.  

## 2014-10-30 NOTE — Progress Notes (Signed)
Discharged to home via wheelchair with script in hand for hydrocodone and tramadol

## 2014-11-06 ENCOUNTER — Telehealth: Payer: Self-pay | Admitting: Pain Medicine

## 2014-11-06 NOTE — Telephone Encounter (Signed)
Could not process UDS lid was loose and it leaked out / no need to return call / just FYI for next visit

## 2014-11-13 ENCOUNTER — Other Ambulatory Visit: Payer: Self-pay | Admitting: Pain Medicine

## 2014-11-20 ENCOUNTER — Ambulatory Visit: Payer: Medicare Other | Attending: Pain Medicine | Admitting: Pain Medicine

## 2014-11-20 ENCOUNTER — Encounter: Payer: Self-pay | Admitting: Pain Medicine

## 2014-11-20 VITALS — BP 153/63 | HR 70 | Temp 97.1°F | Resp 18 | Ht 61.0 in | Wt 212.0 lb

## 2014-11-20 DIAGNOSIS — M5136 Other intervertebral disc degeneration, lumbar region: Secondary | ICD-10-CM | POA: Diagnosis not present

## 2014-11-20 DIAGNOSIS — M7060 Trochanteric bursitis, unspecified hip: Secondary | ICD-10-CM | POA: Insufficient documentation

## 2014-11-20 DIAGNOSIS — M79604 Pain in right leg: Secondary | ICD-10-CM | POA: Diagnosis present

## 2014-11-20 DIAGNOSIS — G5600 Carpal tunnel syndrome, unspecified upper limb: Secondary | ICD-10-CM | POA: Insufficient documentation

## 2014-11-20 DIAGNOSIS — M170 Bilateral primary osteoarthritis of knee: Secondary | ICD-10-CM | POA: Insufficient documentation

## 2014-11-20 DIAGNOSIS — M79605 Pain in left leg: Secondary | ICD-10-CM | POA: Diagnosis present

## 2014-11-20 DIAGNOSIS — M4806 Spinal stenosis, lumbar region: Secondary | ICD-10-CM | POA: Diagnosis not present

## 2014-11-20 DIAGNOSIS — M47816 Spondylosis without myelopathy or radiculopathy, lumbar region: Secondary | ICD-10-CM | POA: Diagnosis not present

## 2014-11-20 DIAGNOSIS — M48062 Spinal stenosis, lumbar region with neurogenic claudication: Secondary | ICD-10-CM

## 2014-11-20 DIAGNOSIS — M545 Low back pain: Secondary | ICD-10-CM | POA: Diagnosis present

## 2014-11-20 DIAGNOSIS — M533 Sacrococcygeal disorders, not elsewhere classified: Secondary | ICD-10-CM

## 2014-11-20 MED ORDER — TRAMADOL HCL 50 MG PO TABS: ORAL_TABLET | ORAL | Status: DC

## 2014-11-20 MED ORDER — HYDROCODONE-ACETAMINOPHEN 5-325 MG PO TABS: ORAL_TABLET | ORAL | Status: DC

## 2014-11-20 NOTE — Patient Instructions (Signed)
Continue present medications tramadol and hydrocodone acetaminophen   F/U PCP Dr. [name]  III for evaliation of  BP and general medical  condition  F/U surgical evaluation  F/U neurological evaluation  May consider radiofrequency rhizolysis or intraspinal procedures pending response to present treatment and F/U evaluation   Patient to call [facility] should patient have concerns prior to scheduled return appointment.

## 2014-11-20 NOTE — Progress Notes (Signed)
   Subjective:    Patient ID: LYNNETTA TOM, female    DOB: 11/09/1923, 81 y.o.   MRN: [id number]  HPI  Patient is a 81 year old female returns to [facility] for further evaluation and treatment of lower back and lower extremity pain. Patient states the pain is fairly well-controlled at this time. Patient admits to increased pain with twisting turning maneuvers and with bending laterally. Patient denies any significant pain occurring in the neck upper back region and headaches at this time. Patient tolerating medications well. We discussed patient's overall condition and for more interventional treatment. Patient is continent by her son on today's visit. Both patient and her son on degenerative treatment plan interventional treatment management. We will continue hydrocodone acetaminophen and tramadol this time. Patient is to call [facility] should any change in condition prior to scheduled return appointment. The patient is understanding and agreed suggested treatment plan.    Review of Systems     Objective:   Physical Exam  There was tenderness to palpation of the splenius capitis and occipitalis musculature regions and palpation is mild discomfort. No masses of the head and neck were noted. There was mild tenderness of the acromioclavicular glenohumeral joint region. Patient appeared to be with unremarkable Spurling's maneuver. There was mild tenderness over the cervical paraspinal musculature region cervical facet region. Palpation over the thoracic facet thoracic paraspinal musculature region reproduced mild discomfort as well. No crepitus of the thoracic region was noted. There was tenderness of the lumbar paraspinal musculature region lumbar facet region of moderate degree. Palpation of the PSIS and PII S region reproduced moderately severe discomfort. There was mild tenderness of the greater trochanteric region iliotibial band region. Straight leg raising was tolerates  approximately 20 without increased pain with dorsiflexion noted. There was negative clonus negative Homans. Superficial varicosities of the lower extremities were noted. No increased warmth and erythema of the lower extremities noted. There was no abdominal tenderness to palpation and no costovertebral angle tenderness noted.       Assessment & Plan:     Degenerative disc disease lumbar spine Multilevel degenerative changes lumbar spine lumbar stenosis multiple levels L3-4 L4-5 L1-2 and L2-3. Lumbar facet degenerative changes, foraminal narrowing, ligamentum flavum hypertrophy  Lumbar stenosis with with neurogenic claudication  Lumbar facet syndrome  Greater trochanteric bursitis  Degenerative joint disease of knees  Carpal tunnel syndrome    PLAN   Continue present medications tramadol and hydrocodone acetaminophen  F/U PCP Dr. [name] III  for evaliation of  BP and general medical  condition  F/U surgical evaluation. Patient  is without plan surgical evaluation or intervention  F/U neurological evaluatio. May consider pending follow-up evaluations   Facet     May consider radiofrequency rhizolysis or intraspinal procedures pending response to present treatment and F/U evaluation   Patient to call [facility] should patient have concerns prior to scheduled return appointment.

## 2014-11-20 NOTE — Progress Notes (Signed)
Safety precautions to be maintained throughout the outpatient stay will include: orient to surroundings, keep bed in low position, maintain call bell within reach at all times, provide assistance with transfer out of bed and ambulation.  

## 2014-12-18 ENCOUNTER — Encounter: Payer: Self-pay | Admitting: Pain Medicine

## 2014-12-18 ENCOUNTER — Ambulatory Visit: Payer: Medicare Other | Attending: Pain Medicine | Admitting: Pain Medicine

## 2014-12-18 VITALS — BP 144/51 | HR 64 | Temp 98.1°F | Resp 16 | Ht 61.0 in | Wt 212.0 lb

## 2014-12-18 DIAGNOSIS — M51369 Other intervertebral disc degeneration, lumbar region without mention of lumbar back pain or lower extremity pain: Secondary | ICD-10-CM

## 2014-12-18 DIAGNOSIS — M174 Other bilateral secondary osteoarthritis of knee: Secondary | ICD-10-CM | POA: Insufficient documentation

## 2014-12-18 DIAGNOSIS — M7060 Trochanteric bursitis, unspecified hip: Secondary | ICD-10-CM | POA: Diagnosis not present

## 2014-12-18 DIAGNOSIS — M79605 Pain in left leg: Secondary | ICD-10-CM | POA: Diagnosis present

## 2014-12-18 DIAGNOSIS — M79604 Pain in right leg: Secondary | ICD-10-CM | POA: Diagnosis present

## 2014-12-18 DIAGNOSIS — M4806 Spinal stenosis, lumbar region: Secondary | ICD-10-CM | POA: Insufficient documentation

## 2014-12-18 DIAGNOSIS — G5600 Carpal tunnel syndrome, unspecified upper limb: Secondary | ICD-10-CM | POA: Diagnosis not present

## 2014-12-18 DIAGNOSIS — M5030 Other cervical disc degeneration, unspecified cervical region: Secondary | ICD-10-CM

## 2014-12-18 DIAGNOSIS — G5603 Carpal tunnel syndrome, bilateral upper limbs: Secondary | ICD-10-CM

## 2014-12-18 DIAGNOSIS — M47816 Spondylosis without myelopathy or radiculopathy, lumbar region: Secondary | ICD-10-CM | POA: Diagnosis not present

## 2014-12-18 DIAGNOSIS — M170 Bilateral primary osteoarthritis of knee: Secondary | ICD-10-CM

## 2014-12-18 DIAGNOSIS — M545 Low back pain: Secondary | ICD-10-CM | POA: Diagnosis present

## 2014-12-18 DIAGNOSIS — M5136 Other intervertebral disc degeneration, lumbar region: Secondary | ICD-10-CM | POA: Diagnosis not present

## 2014-12-18 DIAGNOSIS — M48062 Spinal stenosis, lumbar region with neurogenic claudication: Secondary | ICD-10-CM

## 2014-12-18 DIAGNOSIS — M7062 Trochanteric bursitis, left hip: Secondary | ICD-10-CM

## 2014-12-18 DIAGNOSIS — M7061 Trochanteric bursitis, right hip: Secondary | ICD-10-CM

## 2014-12-18 DIAGNOSIS — M533 Sacrococcygeal disorders, not elsewhere classified: Secondary | ICD-10-CM

## 2014-12-18 MED ORDER — HYDROCODONE-ACETAMINOPHEN 5-325 MG PO TABS: ORAL_TABLET | ORAL | Status: DC

## 2014-12-18 MED ORDER — TRAMADOL HCL 50 MG PO TABS: ORAL_TABLET | ORAL | Status: DC

## 2014-12-18 NOTE — Progress Notes (Signed)
   Subjective:    Patient ID: Marisa [name], female    DOB: 07/04/1923, 81 y.o.   MRN: [id number]  HPI  patient is a 71-year-old female returns [facility] for further evaluation and treatment of pain involving the region of the lower back and lower extremity region predominantly. The patient denies any trauma or change in events of daily living the cause change in symptomatology. Patient states that she has pain with twisting and bending especially. Scars patient's condition on today's visit and patient will continue present medications tramadol and hydrocodone acetaminophen and will take extreme precautions to avoid excessive sedation respiratory depression confusion and other side effects as discussed with patient and patient's daughter on today's visit. We also discussed interventional treatment which we will avoid at this time due to patient's general medical condition. The patient was in agreement with suggested treatment plan.   Review of Systems     Objective:   Physical Exam   there was tenderness of the splenius capitis and occipitalis musculature region of mild degree. There was mild tennis of the cervical facet cervical paraspinal musculature region as well as the thoracic facet thoracic paraspinal musculature region.  There was unremarkable Spurling's maneuver. There was minimal tenderness of the acromioclavicular and glenohumeral joint regions.Patient appeared to be with slightly decreased grip strength on the right compared to the left. Tinel and Phalen's maneuver associated with moderate discomfort on the right compared to the left. There was tenderness to palpation over the thoracic facet thoracic paraspinal musculature region of mild degree. There was mild muscle spasms noted of the upper mid and lower thoracic regions with crepitus of the thoracic region noted. Palpation over the lumbar paraspinal musculature region lumbar facet region was with moderate discomfort. There  was tenderness of the PSIS PSIS region as well as the gluteal and piriformis musculature regions. Palpation of the greater trochanteric region and iliotibial band region reproduced moderate discomfort. Superficial varicosities were noted of the lower extremities. No increased warmth or erythema of the lower extremities were noted. There was negative clonus and negative Homans note no definite sensory deficit of dermatomal distribution of the lower extremities noted. Leg raising was limited to approximately 20 without increased pain with dorsiflexion noted. Abdomen was nontender with no costovertebral angle tenderness noted.      Assessment & Plan:    Degenerative disc disease lumbar spine Multilevel degenerative changes lumbar spine lumbar stenosis multiple levels L3-4 L4-5 L1-2 and L2-3. Lumbar facet degenerative changes, foraminal narrowing, ligamentum flavum hypertrophy  Lumbar stenosis with with neurogenic claudication  Lumbar facet syndrome  Greater trochanteric bursitis  Degenerative joint disease of knees  Carpal tunnel syndrome    PLAN   Continue present medication Tramadol and hydrocodone acetaminophen  F/U PCP Dr. [name] III for evaliation of  BP and general medical  condition  F/U surgical evaluation. Patient is without plans for surgical evaluation or intervention  F/U neurological evaluation. May consider pending follow-up evaluations  May consider radiofrequency rhizolysis or intraspinal procedures pending response to present treatment and F/U evaluation   Patient to call [facility] should patient have concerns prior to scheduled return appointment.

## 2014-12-18 NOTE — Patient Instructions (Signed)
PLAN   Continue present medication tramadol and hydrocodone acetaminophen  F/U PCP Dr. [name] III  for evaliation of  BP and general medical  condition  F/U surgical evaluation. May consider pending follow-up evaluations  F/U neurological evaluation. May consider pending follow-up evaluations  May consider radiofrequency rhizolysis or intraspinal procedures pending response to present treatment and F/U evaluation   Patient to call [facility] should patient have concerns prior to scheduled return appointment.

## 2015-01-16 ENCOUNTER — Ambulatory Visit: Payer: Medicare Other | Attending: Pain Medicine | Admitting: Pain Medicine

## 2015-01-16 ENCOUNTER — Encounter: Payer: Self-pay | Admitting: Pain Medicine

## 2015-01-16 VITALS — BP 170/59 | HR 66 | Temp 95.6°F | Resp 18 | Ht 61.0 in | Wt 208.0 lb

## 2015-01-16 DIAGNOSIS — M79606 Pain in leg, unspecified: Secondary | ICD-10-CM | POA: Diagnosis not present

## 2015-01-16 DIAGNOSIS — M7061 Trochanteric bursitis, right hip: Secondary | ICD-10-CM

## 2015-01-16 DIAGNOSIS — G5603 Carpal tunnel syndrome, bilateral upper limbs: Secondary | ICD-10-CM

## 2015-01-16 DIAGNOSIS — M7060 Trochanteric bursitis, unspecified hip: Secondary | ICD-10-CM | POA: Diagnosis not present

## 2015-01-16 DIAGNOSIS — M533 Sacrococcygeal disorders, not elsewhere classified: Secondary | ICD-10-CM

## 2015-01-16 DIAGNOSIS — M5030 Other cervical disc degeneration, unspecified cervical region: Secondary | ICD-10-CM

## 2015-01-16 DIAGNOSIS — M5136 Other intervertebral disc degeneration, lumbar region: Secondary | ICD-10-CM | POA: Diagnosis not present

## 2015-01-16 DIAGNOSIS — M48062 Spinal stenosis, lumbar region with neurogenic claudication: Secondary | ICD-10-CM

## 2015-01-16 DIAGNOSIS — M4806 Spinal stenosis, lumbar region: Secondary | ICD-10-CM | POA: Insufficient documentation

## 2015-01-16 DIAGNOSIS — M170 Bilateral primary osteoarthritis of knee: Secondary | ICD-10-CM | POA: Diagnosis not present

## 2015-01-16 DIAGNOSIS — M7062 Trochanteric bursitis, left hip: Secondary | ICD-10-CM

## 2015-01-16 DIAGNOSIS — G5600 Carpal tunnel syndrome, unspecified upper limb: Secondary | ICD-10-CM | POA: Insufficient documentation

## 2015-01-16 DIAGNOSIS — M545 Low back pain: Secondary | ICD-10-CM | POA: Insufficient documentation

## 2015-01-16 MED ORDER — TRAMADOL HCL 50 MG PO TABS: ORAL_TABLET | ORAL | Status: DC

## 2015-01-16 MED ORDER — HYDROCODONE-ACETAMINOPHEN 5-325 MG PO TABS: ORAL_TABLET | ORAL | Status: DC

## 2015-01-16 NOTE — Progress Notes (Signed)
Safety precautions to be maintained throughout the outpatient stay will include: orient to surroundings, keep bed in low position, maintain call bell within reach at all times, provide assistance with transfer out of bed and ambulation.  

## 2015-01-16 NOTE — Progress Notes (Signed)
   Subjective:    Patient ID: Marisa SaversGrace R [name], female    DOB: 1924/02/21, 81 y.o.   MRN: [id number]  HPI Patient's age 81-year-old female who returns to [facility] for further evaluation and treatment of pain involving the region of the lower back and lower extremity regions. The patient states that her pain at the present time is fairly well-controlled. We discussed patient's medications on today's visit and have advised patient to attempt to take her tramadol in the morning and evening and to take her hydrocodone acetaminophen in the morning midday and evening. The patient stated that the hydrocodone does not cause any drowsiness. The patient was accompanied by her daughter on today's visit and all in agreement with plan to adjust medications as previously mentioned. Decision has been made to avoid interventional treatment at this time is as explained to the patient. All understanding and in agreement with suggested treatment plan   Review of Systems     Objective:   Physical Exam  There was tends to palpation of the splenius capitis and occipitalis musculature regions. Palpation of these regions reproduced pain of mild degree. There was mild tenderness of the cervical facet cervical paraspinal musculature region as well as the thoracic facet thoracic paraspinal musculature regions. There was mild tennis to palpation over the acromioclavicular and glenohumeral joint regions. Tinel and Phalen's maneuver associated with mild discomfort. There was no increased warmth or erythema of the upper extremities noted. Patient over the lumbar paraspinal muscular region lumbar facet region was was tends to palpation of moderate degree. Lateral bending and palpation over the lumbar facets reproduce moderately severe discomfort. There was tenderness over the PSIS and PII S regions as well as the greater trochanteric region and iliotibial band region of moderate degree. Superficial varicosities of the lower  extremities were noted. No increased warmth erythema of the lower extremities were noted. There was tenderness of the knees with crepitus of the knees with negative anterior and posterior drawer signs without ballottement of the patella. EHL strength appeared to be slightly decreased. No definite sensory deficit of dermatomal distribution detected. There was negative clonus negative Homans. Abdomen was protuberant without excessive tends to palpation and no costovertebral angle tenderness was noted.      Assessment & Plan:     Degenerative disc disease lumbar spine Multilevel degenerative changes lumbar spine lumbar stenosis multiple levels L3-4 L4-5 L1-2 and L2-3. Lumbar facet degenerative changes, foraminal narrowing, ligamentum flavum hypertrophy  Lumbar stenosis with with neurogenic claudication  Lumbar facet syndrome  Greater trochanteric bursitis  Degenerative joint disease of knees  Carpal tunnel syndrome    PLAN  Continue present medication Tramadol and hydrocodone acetaminophen as discussed. Patient will take hydrocodone acetaminophen morning midday and evening. Patient will take tramadol in the morning and evening only. Patient will avoid these medications if she notices any drowsiness confusion or other undesirable side effects. Patient's daughter was present on today's visit who agreed with suggested treatment plan  F/U PCP Dr. Yates DecampJohn Walker III for evaliation of  BP and general medical  condition  F/U surgical evaluation. Patient is without plans for surgical evaluation or intervention  F/U neurological evaluation. May consider pending follow-up evaluations  May consider radiofrequency rhizolysis or intraspinal procedures pending response to present treatment and F/U evaluation   Patient to call [facility] should patient have concerns prior to scheduled return appointment.

## 2015-01-16 NOTE — Patient Instructions (Signed)
PLAN   Continue present medication tramadol and hydrocodone acetaminophen  F/U PCP Dr. Yates DecampJohn Walker [name]  for evaliation of  BP and general medical  condition  F/U surgical evaluation. May consider pending follow-up evaluations . Patient has undergone prior surgical evaluation and without plans for surgical intervention due to general medical condition  F/U neurological evaluation. May consider pending follow-up evaluations  May consider radiofrequency rhizolysis or intraspinal procedures pending response to present treatment and F/U evaluation   Patient to call [facility] should patient have concerns prior to scheduled return appointment.

## 2015-02-18 ENCOUNTER — Encounter: Payer: Self-pay | Admitting: Pain Medicine

## 2015-02-18 ENCOUNTER — Ambulatory Visit: Payer: Medicare Other | Attending: Pain Medicine | Admitting: Pain Medicine

## 2015-02-18 VITALS — BP 189/67 | HR 81 | Temp 98.0°F | Resp 16 | Ht 60.0 in | Wt 212.0 lb

## 2015-02-18 DIAGNOSIS — Z96652 Presence of left artificial knee joint: Secondary | ICD-10-CM | POA: Diagnosis not present

## 2015-02-18 DIAGNOSIS — M48062 Spinal stenosis, lumbar region with neurogenic claudication: Secondary | ICD-10-CM

## 2015-02-18 DIAGNOSIS — M170 Bilateral primary osteoarthritis of knee: Secondary | ICD-10-CM | POA: Diagnosis not present

## 2015-02-18 DIAGNOSIS — G5603 Carpal tunnel syndrome, bilateral upper limbs: Secondary | ICD-10-CM

## 2015-02-18 DIAGNOSIS — Z96651 Presence of right artificial knee joint: Secondary | ICD-10-CM | POA: Insufficient documentation

## 2015-02-18 DIAGNOSIS — M7060 Trochanteric bursitis, unspecified hip: Secondary | ICD-10-CM | POA: Insufficient documentation

## 2015-02-18 DIAGNOSIS — M47816 Spondylosis without myelopathy or radiculopathy, lumbar region: Secondary | ICD-10-CM | POA: Diagnosis not present

## 2015-02-18 DIAGNOSIS — M79604 Pain in right leg: Secondary | ICD-10-CM | POA: Diagnosis present

## 2015-02-18 DIAGNOSIS — M5030 Other cervical disc degeneration, unspecified cervical region: Secondary | ICD-10-CM

## 2015-02-18 DIAGNOSIS — M533 Sacrococcygeal disorders, not elsewhere classified: Secondary | ICD-10-CM

## 2015-02-18 DIAGNOSIS — M7061 Trochanteric bursitis, right hip: Secondary | ICD-10-CM

## 2015-02-18 DIAGNOSIS — M545 Low back pain: Secondary | ICD-10-CM | POA: Diagnosis present

## 2015-02-18 DIAGNOSIS — M4806 Spinal stenosis, lumbar region: Secondary | ICD-10-CM | POA: Insufficient documentation

## 2015-02-18 DIAGNOSIS — M7062 Trochanteric bursitis, left hip: Secondary | ICD-10-CM

## 2015-02-18 DIAGNOSIS — M5136 Other intervertebral disc degeneration, lumbar region: Secondary | ICD-10-CM | POA: Diagnosis not present

## 2015-02-18 DIAGNOSIS — G5600 Carpal tunnel syndrome, unspecified upper limb: Secondary | ICD-10-CM | POA: Diagnosis not present

## 2015-02-18 DIAGNOSIS — M79605 Pain in left leg: Secondary | ICD-10-CM | POA: Diagnosis present

## 2015-02-18 MED ORDER — HYDROCODONE-ACETAMINOPHEN 5-325 MG PO TABS: ORAL_TABLET | ORAL | Status: DC

## 2015-02-18 MED ORDER — TRAMADOL HCL 50 MG PO TABS: ORAL_TABLET | ORAL | Status: DC

## 2015-02-18 NOTE — Progress Notes (Signed)
Subjective:    Patient ID: Marisa [name], female    DOB: 1924-02-24, 81 y.o.   MRN: [id number]  HPI  The patient is an 81 year old female who returns to pain management for further evaluation and treatment of pain involving the lower back and lower extremity region predominantly. The patient has pain of lesser degree involving the right upper extremity and shoulder. At the present time we discussed patient's condition and patient admits to some discomfort of the knees. The patient is status post left and right total knee replacement. We have suggested that patient may benefit from wearing brace of the knee. We will have patient undergo evaluation with surgeon to obtain brace of the knee and we will remain available to consider additional modifications of treatment regimen. The patient is tolerating hydrocodone acetaminophen and tramadol very well without undesirable side effects. Discuss medication management of patient and we have also discussed interventional treatment. We preferred to avoid interventional treatment due to patient's general medical condition. We will continue present medications and patient will proceed with surgical evaluation to be considered for brace of the knee. All were understanding and agreed with suggested treatment plan.       Review of Systems     Objective:   Physical Exam There was tenderness to palpation of the splenius capitis and a separate talus regions. Palpation of these regions reproduced pain of mild degree. There was mild tenderness of the acromioclavicular and glenohumeral joint region. Patient appeared to be with decreased grip strength on the right compared to the left. Tinel and Phalen's maneuver associated with moderate discomfort on the right compared to the left. Palpation over the thoracic facet thoracic paraspinal musculature region reproduced pain of mild degree. There was no crepitus of the thoracic region noted. Palpation over the lumbar  paraspinal muscles lumbar facet region was associated tends to palpation of moderate degree. There was moderate to moderately severe tenderness to palpation of the greater trochanteric region and iliotibial band region. There was moderate tenderness to palpation of the PSIS and PI is regions. Knees were with well-healed surgical scars of the knee without increased warmth erythema of the knees. There were superficial varicosities of the lower extremities with negative clonus negative Homans No definite sensory deficit or dermatomal distribution detected.Marland Kitchen. EHL strength was decreased. Abdomen nontender with no costovertebral tenderness noted.     Assessment & Plan:  Degenerative disc disease lumbar spine Multilevel degenerative changes lumbar spine lumbar stenosis multiple levels L3-4 L4-5 L1-2 and L2-3. Lumbar facet degenerative changes, foraminal narrowing, ligamentum flavum hypertrophy  Lumbar stenosis with with neurogenic claudication  Lumbar facet syndrome  Greater trochanteric bursitis  Degenerative joint disease of knees  Carpal tunnel syndrome    PLAN   Continue present medications hydrocodone acetaminophen and tramadol  F/U PCP Marisa [name] for evaliation of  BP and general medical  condition  F/U surgical evaluation. . Patient to undergo surgical evaluation and to discuss obtain brace for knees. The patient is status post left and right total knee replacement and may benefit from wearing brace of knees which may improve discomfort of the knees as well as improved patient's lower back pain. The patient will make appointment for surgical evaluation to consider obtaining brace for knees  F/U neurological evaluation. May consider pending follow-up evaluations  May consider radiofrequency rhizolysis or intraspinal procedures pending response to present treatment and F/U evaluation   Patient to call [facility] should patient have concerns prior to scheduled return  appointment.

## 2015-02-18 NOTE — Patient Instructions (Addendum)
c 

## 2015-02-18 NOTE — Progress Notes (Signed)
Safety precautions to be maintained throughout the outpatient stay will include: orient to surroundings, keep bed in low position, maintain call bell within reach at all times, provide assistance with transfer out of bed and ambulation.  

## 2015-03-19 ENCOUNTER — Encounter: Payer: Self-pay | Admitting: Pain Medicine

## 2015-03-19 ENCOUNTER — Ambulatory Visit: Payer: Medicare Other | Attending: Pain Medicine | Admitting: Pain Medicine

## 2015-03-19 VITALS — BP 143/51 | HR 83 | Temp 98.0°F | Resp 18 | Ht 60.0 in | Wt 212.0 lb

## 2015-03-19 DIAGNOSIS — M533 Sacrococcygeal disorders, not elsewhere classified: Secondary | ICD-10-CM

## 2015-03-19 DIAGNOSIS — M5136 Other intervertebral disc degeneration, lumbar region: Secondary | ICD-10-CM

## 2015-03-19 DIAGNOSIS — M7061 Trochanteric bursitis, right hip: Secondary | ICD-10-CM

## 2015-03-19 DIAGNOSIS — M170 Bilateral primary osteoarthritis of knee: Secondary | ICD-10-CM

## 2015-03-19 DIAGNOSIS — M7062 Trochanteric bursitis, left hip: Secondary | ICD-10-CM

## 2015-03-19 DIAGNOSIS — G5603 Carpal tunnel syndrome, bilateral upper limbs: Secondary | ICD-10-CM

## 2015-03-19 DIAGNOSIS — M51369 Other intervertebral disc degeneration, lumbar region without mention of lumbar back pain or lower extremity pain: Secondary | ICD-10-CM

## 2015-03-19 DIAGNOSIS — G5600 Carpal tunnel syndrome, unspecified upper limb: Secondary | ICD-10-CM

## 2015-03-19 DIAGNOSIS — M48062 Spinal stenosis, lumbar region with neurogenic claudication: Secondary | ICD-10-CM

## 2015-03-19 DIAGNOSIS — M5030 Other cervical disc degeneration, unspecified cervical region: Secondary | ICD-10-CM

## 2015-03-19 MED ORDER — HYDROCODONE-ACETAMINOPHEN 5-325 MG PO TABS: ORAL_TABLET | ORAL | Status: DC

## 2015-03-19 MED ORDER — TRAMADOL HCL 50 MG PO TABS: ORAL_TABLET | ORAL | Status: DC

## 2015-03-19 NOTE — Progress Notes (Signed)
Safety precautions to be maintained throughout the outpatient stay will include: orient to surroundings, keep bed in low position, maintain call bell within reach at all times, provide assistance with transfer out of bed and ambulation.  

## 2015-03-19 NOTE — Progress Notes (Signed)
Subjective:    Patient ID: Marisa [name], female    DOB: Oct 30, 1923, 81 y.o.   MRN: [id number]  HPI  The patient is an 81 year old female who returns to pain management Center for further evaluation and treatment of pain involving the mid lower back and lower extremity regions. The patient is a pain involving the upper right extremity as well status post fracture of the upper extremities. On today's visit we discussed patient applying Voltaren gel to the right wrist and wearing wrist splints at night. The patient will proceed with applying Voltaren gel and pending follow-up evaluation May consider patient for additional modifications of treatment regimen. The patient denies any recent trauma change in events of daily living the call significant change in symptomatology. Patient states that the lower back and lower extremity pain is fairly well-controlled at this time and states that she is able to enjoy most activities of daily living without experiencing severe disabling pain. The patient's pain is aggravated by twisting bending to the side especially. She'll treatment and will continue present medications consisting of tramadol and hydrocodone acetaminophen and we'll remain available to consider patient for modification of treatment regimen pending follow-up evaluation. Is understanding and agreement suggested treatment plan      Review of Systems     Objective:   Physical Exam  There was tends to palpation of the splenius capitis and occipitalis musculature regions palpation which reproduces minimal discomfort. There was minimal tense to palpation of the acromioclavicular and glenohumeral joint regions. Palpation over the cervical facet cervical paraspinal musculature region reproduces minimal discomfort. Tinel and Phalen's maneuver associated with moderate tenderness to palpation right upper extremity especially. There was decreased range of motion of the right wrist with no definite  allodynia noted.  There was minimal tenderness to palpation over the thoracic facet thoracic paraspinal musculature region with no crepitus of the thoracic region noted. Palpation of the lumbar paraspinal must reason lumbar facet region was attends to palpation of moderate degree. There was moderate tensed palpation of the PSIS and PII S region as well as the gluteal and piriformis muscles regions. Palpation of the greater trochanteric region iliotibial band region associated with moderate tends to palpation. Straight leg raising was tolerates approximately 20 without increased pain with dorsiflexion noted. There was negative clonus negative Homans. Superficial varicosities of the lower extremities were noted without increased warmth erythema of the lower extremities noted. No definite sensory deficit or dermatomal distribution detected. EHL strength appeared to be decreased abdomen protuberant without excessive tends to palpation      Assessment & Plan:    Degenerative disc disease lumbar spine Multilevel degenerative changes lumbar spine lumbar stenosis multiple levels L3-4 L4-5 L1-2 and L2-3. Lumbar facet degenerative changes, foraminal narrowing, ligamentum flavum hypertrophy  Lumbar stenosis with with neurogenic claudication  Lumbar facet syndrome  Greater trochanteric bursitis  Degenerative joint disease of knees  Carpal tunnel syndrome    PLAN  Continue present medications tramadol and hydrocodone acetaminophen and apply Voltaren gel to wrist as prescribed  F/U PCP Dr. [name] III for evaliation of  BP and general medical  condition  F/U surgical evaluation. May consider pending follow-up evaluations The patient is with prior surgical evaluation without recommendation for surgical intervention  F/U neurological evaluation. May consider pending follow-up evaluations  May consider radiofrequency rhizolysis or intraspinal procedures pending response to present treatment and  F/U evaluation . We will avoid such procedures for this patient  Patient to call [facility] should  patient have concerns prior to scheduled return appointment.

## 2015-04-16 ENCOUNTER — Encounter: Payer: Self-pay | Admitting: Pain Medicine

## 2015-04-16 ENCOUNTER — Ambulatory Visit: Payer: Medicare Other | Attending: Pain Medicine | Admitting: Pain Medicine

## 2015-04-16 VITALS — BP 175/79 | HR 75 | Temp 97.9°F | Resp 18 | Ht 60.0 in | Wt 212.0 lb

## 2015-04-16 DIAGNOSIS — M4806 Spinal stenosis, lumbar region: Secondary | ICD-10-CM | POA: Diagnosis not present

## 2015-04-16 DIAGNOSIS — M47816 Spondylosis without myelopathy or radiculopathy, lumbar region: Secondary | ICD-10-CM | POA: Diagnosis not present

## 2015-04-16 DIAGNOSIS — M5030 Other cervical disc degeneration, unspecified cervical region: Secondary | ICD-10-CM

## 2015-04-16 DIAGNOSIS — G5603 Carpal tunnel syndrome, bilateral upper limbs: Secondary | ICD-10-CM

## 2015-04-16 DIAGNOSIS — M533 Sacrococcygeal disorders, not elsewhere classified: Secondary | ICD-10-CM

## 2015-04-16 DIAGNOSIS — M25511 Pain in right shoulder: Secondary | ICD-10-CM | POA: Diagnosis present

## 2015-04-16 DIAGNOSIS — M7061 Trochanteric bursitis, right hip: Secondary | ICD-10-CM

## 2015-04-16 DIAGNOSIS — M25512 Pain in left shoulder: Secondary | ICD-10-CM | POA: Diagnosis present

## 2015-04-16 DIAGNOSIS — M7060 Trochanteric bursitis, unspecified hip: Secondary | ICD-10-CM | POA: Diagnosis not present

## 2015-04-16 DIAGNOSIS — M48062 Spinal stenosis, lumbar region with neurogenic claudication: Secondary | ICD-10-CM

## 2015-04-16 DIAGNOSIS — G5601 Carpal tunnel syndrome, right upper limb: Secondary | ICD-10-CM | POA: Insufficient documentation

## 2015-04-16 DIAGNOSIS — M5136 Other intervertebral disc degeneration, lumbar region: Secondary | ICD-10-CM | POA: Diagnosis not present

## 2015-04-16 DIAGNOSIS — M7062 Trochanteric bursitis, left hip: Secondary | ICD-10-CM

## 2015-04-16 DIAGNOSIS — M170 Bilateral primary osteoarthritis of knee: Secondary | ICD-10-CM | POA: Diagnosis not present

## 2015-04-16 DIAGNOSIS — G5600 Carpal tunnel syndrome, unspecified upper limb: Secondary | ICD-10-CM

## 2015-04-16 MED ORDER — HYDROCODONE-ACETAMINOPHEN 5-325 MG PO TABS: ORAL_TABLET | ORAL | Status: DC

## 2015-04-16 MED ORDER — TRAMADOL HCL 50 MG PO TABS: ORAL_TABLET | ORAL | Status: DC

## 2015-04-16 NOTE — Progress Notes (Signed)
Subjective:    Patient ID: Marisa [name], female    DOB: 1923/07/06, 82 y.o.   MRN: [id number]  HPI  The patient is a 82 year old female who returns to pain management for further evaluation and treatment of pain involving the shoulders and upper extremity region especially on the right and the reason the right wrist as well as the lower back and lower extremity regions. On today's visit we discussed patient's medications consisting of hydrocodone acetaminophen and tramadol. Decision was made to have patient take hydrocodone acetaminophen without the use of tramadol. We discussed patient taking medication on a scheduled basis with there being need to closely observe patient for any signs of excessive sedation and respiratory depression confusion or other undesirable side effects. The patient expressed willingness to comply with suggested treatment plan. We discussed interventional treatment and patient was a significant tenderness to palpation in the region of the gluteal and piriformis musculature region. We advised patient to inform us if this area continue to be aggravating and we will consider interventional treatment in attempt to decrease the pain was significantly and also minimize the need for medications. The patient was with understanding and agreement suggested treatment plan. At the present time we will observe patient's response to the hydrocodone acetaminophen as discussed. All were understanding and agreement suggested treatment plan. The patient is undergone prior surgical evaluation without recommendation for surgical intervention and consideration of patient's general medical condition.  Review of Systems     Objective:   Physical Exam  There was tenderness of the splenius capitis and occipitalis musculature region a mild degree with mild tenderness over the cervical facet cervical paraspinal musculature region. There was mild tinnitus of the thoracic facet thoracic paraspinal  musculature region. Patient was attends to palpation of the right wrist compared to the left wrist with mild discomfort noted with Tinel and Phalen's maneuver. There was tends to palpation over the region of the acromioclavicular and glenohumeral joint region a mild to moderate degree. There was tenderness over the thoracic facet thoracic paraspinal musculature region with evidence of muscle spasm without crepitus of the thoracic region noted. Palpation over the lumbar paraspinal musculatures and lumbar facet region was with moderate to moderately severe discomfort with lateral bending rotation extension and palpation of the lumbar facets reproducing moderate to moderately severe discomfort. There was tenderness of the gluteal and piriformis musculature region on the right greater than left with mild to moderate tenderness of the greater trochanteric region and iliotibial band region. No definite sensory deficit or dermatomal dystrophy she was detected. Superficial varicosities of the lower extremity is were noted with no increased warmth erythema of the lower extremities noted with negative clonus and negative Homans. There was crepitus of the knees noted without increased warmth and erythema in the region of the knees noted there was decreased EHL strength negative clonus negative Homans. Abdomen protuberant without excessive tends to palpation and no costovertebral tenderness noted.      Assessment & Plan:    Degenerative disc disease lumbar spine Multilevel degenerative changes lumbar spine lumbar stenosis multiple levels L3-4 L4-5 L1-2 and L2-3. Lumbar facet degenerative changes, foraminal narrowing, ligamentum flavum hypertrophy  Lumbar stenosis with with neurogenic claudication  Lumbar facet syndrome  Greater trochanteric bursitis  Degenerative joint disease of knees  Carpal tunnel syndrome (status post fracture of right wrist)     PLAN   Continue present medication  hydrocodone  acetaminophen and avoid the use of tramadol at this time At this time  the patient will attempt to take hydrocodone acetaminophen only and will take 2-3-1/2 hydrocodone acetaminophen per day if tolerated without drowsiness confusion and other undesirable side effects also discussed taking 2-3-1/2 hydrocodone acetaminophen only one day and alternated with a lower dose on the following day. We will observe response to hydrocodone acetaminophen without tramadol and consider modifications of treatment as discussed. The patient was cautioned regarding respiratory depression confusion excessive sedation and other side effects. All agreed to suggested treatment plan.  F/U PCP Dr. [name] III  for evaliation of  BP and general medical  condition  F/U surgical evaluation. May consider pending follow-up evaluations . Patient has undergone prior surgical evaluation and without plans for surgical intervention due to general medical condition  F/U neurological evaluation. May consider pending follow-up evaluations  May consider radiofrequency rhizolysis or intraspinal procedures pending response to present treatment and F/U evaluation   Patient to call [facility] should patient have concerns prior to scheduled return appointment.

## 2015-04-16 NOTE — Patient Instructions (Signed)
PLAN   Continue present medication tramadol and hydrocodone acetaminophen  F/U PCP Dr. [name]  for evaliation of  BP and general medical  condition  F/U surgical evaluation. May consider pending follow-up evaluations . Patient has undergone prior surgical evaluation and without plans for surgical intervention due to general medical condition  F/U neurological evaluation. May consider pending follow-up evaluations  May consider radiofrequency rhizolysis or intraspinal procedures pending response to present treatment and F/U evaluation   Patient to call [facility] should patient have concerns prior to scheduled return appointment. 

## 2015-04-16 NOTE — Progress Notes (Signed)
Safety precautions to be maintained throughout the outpatient stay will include: orient to surroundings, keep bed in low position, maintain call bell within reach at all times, provide assistance with transfer out of bed and ambulation.  

## 2015-04-18 ENCOUNTER — Encounter: Payer: Medicare Other | Admitting: Pain Medicine

## 2015-04-23 ENCOUNTER — Other Ambulatory Visit: Payer: Self-pay | Admitting: Pain Medicine

## 2015-05-14 ENCOUNTER — Ambulatory Visit: Payer: Medicare Other | Attending: Pain Medicine | Admitting: Pain Medicine

## 2015-05-14 ENCOUNTER — Encounter: Payer: Self-pay | Admitting: Pain Medicine

## 2015-05-14 VITALS — BP 153/53 | HR 65 | Temp 97.8°F | Resp 18 | Ht 61.0 in | Wt 212.0 lb

## 2015-05-14 DIAGNOSIS — G5600 Carpal tunnel syndrome, unspecified upper limb: Secondary | ICD-10-CM

## 2015-05-14 DIAGNOSIS — G5601 Carpal tunnel syndrome, right upper limb: Secondary | ICD-10-CM | POA: Insufficient documentation

## 2015-05-14 DIAGNOSIS — M7060 Trochanteric bursitis, unspecified hip: Secondary | ICD-10-CM | POA: Diagnosis not present

## 2015-05-14 DIAGNOSIS — M5030 Other cervical disc degeneration, unspecified cervical region: Secondary | ICD-10-CM

## 2015-05-14 DIAGNOSIS — M4806 Spinal stenosis, lumbar region: Secondary | ICD-10-CM | POA: Insufficient documentation

## 2015-05-14 DIAGNOSIS — M533 Sacrococcygeal disorders, not elsewhere classified: Secondary | ICD-10-CM

## 2015-05-14 DIAGNOSIS — R51 Headache: Secondary | ICD-10-CM | POA: Diagnosis not present

## 2015-05-14 DIAGNOSIS — M542 Cervicalgia: Secondary | ICD-10-CM | POA: Insufficient documentation

## 2015-05-14 DIAGNOSIS — M170 Bilateral primary osteoarthritis of knee: Secondary | ICD-10-CM

## 2015-05-14 DIAGNOSIS — M48062 Spinal stenosis, lumbar region with neurogenic claudication: Secondary | ICD-10-CM

## 2015-05-14 DIAGNOSIS — M47816 Spondylosis without myelopathy or radiculopathy, lumbar region: Secondary | ICD-10-CM | POA: Insufficient documentation

## 2015-05-14 DIAGNOSIS — G5603 Carpal tunnel syndrome, bilateral upper limbs: Secondary | ICD-10-CM

## 2015-05-14 DIAGNOSIS — M5136 Other intervertebral disc degeneration, lumbar region: Secondary | ICD-10-CM | POA: Diagnosis not present

## 2015-05-14 DIAGNOSIS — M7061 Trochanteric bursitis, right hip: Secondary | ICD-10-CM

## 2015-05-14 DIAGNOSIS — M7062 Trochanteric bursitis, left hip: Secondary | ICD-10-CM

## 2015-05-14 MED ORDER — TRAMADOL HCL 50 MG PO TABS: ORAL_TABLET | ORAL | Status: DC

## 2015-05-14 MED ORDER — HYDROCODONE-ACETAMINOPHEN 5-325 MG PO TABS: ORAL_TABLET | ORAL | Status: DC

## 2015-05-14 NOTE — Patient Instructions (Signed)
PLAN   Continue present medication tramadol and hydrocodone acetaminophen  F/U PCP Dr. [name]  for evaliation of  BP and general medical  condition  F/U surgical evaluation. May consider pending follow-up evaluations . Patient has undergone prior surgical evaluation and without plans for surgical intervention due to general medical condition  F/U neurological evaluation. May consider pending follow-up evaluations  May consider radiofrequency rhizolysis or intraspinal procedures pending response to present treatment and F/U evaluation   Patient to call [facility] should patient have concerns prior to scheduled return appointment. 

## 2015-05-14 NOTE — Progress Notes (Signed)
Subjective:    Patient ID: Marisa [name], female    DOB: 1923/04/26, 82 y.o.   MRN: [id number]  HPI  The patient is a 82 year old female who returns to pain management for further evaluation and treatment of pain involving the neck pain Radiating to the back of the hip precipitating headaches. Patient is with lower back lower extremity pain which is predominant portion of pain aggravated by twisting turning maneuvers especially. The patient continues medications with significant benefit. We will avoid interventional treatment due to patient's general medical condition. We discussed patient's condition and patient will continue hydrocodone acetaminophen and tramadol as discussed with caution to avoid excessive sedation with depression other undesirable side effects. The patient denies any such side effects at this time. The patient is able to perform activities of daily living with minimal interference of activities due to severe pain. The patient's activities are limited and patient is able to manage most activities by taking medications and resting for some periods of time when the pain becomes more intense. We will remain available to consider patient for treatment including interventional treatment should there be significant change in condition. The patient was with understanding and agreement suggested treatment plan.  Review of Systems     Objective:   Physical Exam  There was tenderness of the splenius capitis and occipitalis musculature region a mild degree with mild tenderness over the cervical facet cervical paraspinal muscular region. Palpation of the acromioclavicular and glenohumeral joint regions reproduces minimal discomfort. The patient was with decreased grip strength on the right compared to the left. There was moderate increase of pain with Tinel and Phalen's maneuver on the right compared to the left. Palpation over the thoracic facet thoracic paraspinal muscular treat and was  with evidence of muscle spasms of the mid lower thoracic region with no crepitus of the thoracic region noted. Palpation over the lumbar paraspinal must reason lumbar facet region was attends to palpation of mild to moderate degree. Lateral bending rotation extension and palpation of lumbar facets reproduce mild to moderate discomfort. There was tenderness over the PSIS and P IIS region a moderate degree with mild tenderness on the greater trochanteric region iliotibial band region. Straight leg raise was tolerates 20 without increased pain with dorsiflexion noted. There was superficial varicosities of the lower extremities with evidence of early states stasis dermatitis changes of the lower extremity with negative clonus negative Homans EHL strength appeared to be decreased. No definite sensory deficit or dermatomal dystrophy detected. Knees were with well-healed surgical scars of the knees without increased warmth and erythema in the region of the scars. Abdomen nontender and protuberant with no excessive tends to palpation of the costovertebral angle region    Assessment & Plan:  Degenerative disc disease lumbar spine Multilevel degenerative changes lumbar spine lumbar stenosis multiple levels L3-4 L4-5 L1-2 and L2-3. Lumbar facet degenerative changes, foraminal narrowing, ligamentum flavum hypertrophy  Lumbar stenosis with with neurogenic claudication  Lumbar facet syndrome  Greater trochanteric bursitis  Degenerative joint disease of knees  Carpal tunnel syndrome (status post fracture of right wrist)    PLAN   Continue present medication tramadol and hydrocodone acetaminophen  F/U PCP Dr. [name] III  for evaliation of  BP and general medical  condition  F/U surgical evaluation. May consider pending follow-up evaluations . Patient has undergone prior surgical evaluation and without plans for surgical intervention due to general medical condition  F/U neurological evaluation. May  consider pending follow-up evaluations  May consider  radiofrequency rhizolysis or intraspinal procedures pending response to present treatment and F/U evaluation   Patient to call [facility] should patient have concerns prior to scheduled return appointment.

## 2015-06-11 ENCOUNTER — Ambulatory Visit: Payer: Medicare Other | Attending: Pain Medicine | Admitting: Pain Medicine

## 2015-06-11 ENCOUNTER — Encounter: Payer: Self-pay | Admitting: Pain Medicine

## 2015-06-11 VITALS — BP 171/61 | HR 75 | Temp 96.1°F | Resp 16 | Ht 61.0 in | Wt 210.0 lb

## 2015-06-11 DIAGNOSIS — G5601 Carpal tunnel syndrome, right upper limb: Secondary | ICD-10-CM | POA: Diagnosis not present

## 2015-06-11 DIAGNOSIS — M47816 Spondylosis without myelopathy or radiculopathy, lumbar region: Secondary | ICD-10-CM | POA: Diagnosis not present

## 2015-06-11 DIAGNOSIS — M533 Sacrococcygeal disorders, not elsewhere classified: Secondary | ICD-10-CM

## 2015-06-11 DIAGNOSIS — M48062 Spinal stenosis, lumbar region with neurogenic claudication: Secondary | ICD-10-CM

## 2015-06-11 DIAGNOSIS — Z8781 Personal history of (healed) traumatic fracture: Secondary | ICD-10-CM | POA: Diagnosis not present

## 2015-06-11 DIAGNOSIS — M7060 Trochanteric bursitis, unspecified hip: Secondary | ICD-10-CM | POA: Insufficient documentation

## 2015-06-11 DIAGNOSIS — M7062 Trochanteric bursitis, left hip: Secondary | ICD-10-CM

## 2015-06-11 DIAGNOSIS — M5030 Other cervical disc degeneration, unspecified cervical region: Secondary | ICD-10-CM

## 2015-06-11 DIAGNOSIS — G5600 Carpal tunnel syndrome, unspecified upper limb: Secondary | ICD-10-CM

## 2015-06-11 DIAGNOSIS — M5136 Other intervertebral disc degeneration, lumbar region: Secondary | ICD-10-CM | POA: Diagnosis not present

## 2015-06-11 DIAGNOSIS — M170 Bilateral primary osteoarthritis of knee: Secondary | ICD-10-CM | POA: Diagnosis not present

## 2015-06-11 DIAGNOSIS — R51 Headache: Secondary | ICD-10-CM | POA: Diagnosis present

## 2015-06-11 DIAGNOSIS — M542 Cervicalgia: Secondary | ICD-10-CM | POA: Diagnosis present

## 2015-06-11 DIAGNOSIS — M4806 Spinal stenosis, lumbar region: Secondary | ICD-10-CM | POA: Insufficient documentation

## 2015-06-11 DIAGNOSIS — G5603 Carpal tunnel syndrome, bilateral upper limbs: Secondary | ICD-10-CM

## 2015-06-11 DIAGNOSIS — M7061 Trochanteric bursitis, right hip: Secondary | ICD-10-CM

## 2015-06-11 DIAGNOSIS — M545 Low back pain: Secondary | ICD-10-CM | POA: Diagnosis present

## 2015-06-11 MED ORDER — TRAMADOL HCL 50 MG PO TABS: ORAL_TABLET | ORAL | Status: DC

## 2015-06-11 MED ORDER — HYDROCODONE-ACETAMINOPHEN 5-325 MG PO TABS: ORAL_TABLET | ORAL | Status: DC

## 2015-06-11 NOTE — Patient Instructions (Addendum)
PLAN   Continue present medication tramadol and hydrocodone acetaminophen as we discussed today  F/U PCP Dr. Yates DecampJohn Walker [name]  for evaliation of  BP and general medical  condition  F/U surgical evaluation. May consider pending follow-up evaluations . Patient has undergone prior surgical evaluation and without plans for surgical intervention due to general medical condition  F/U neurological evaluation. May consider pending follow-up evaluations We will avoid [facility] EMG studies and such studies at this time  May consider radiofrequency rhizolysis or intraspinal procedures pending response to present treatment and F/U evaluation  We will avoid such treatment at this time  Patient to call Pain Management Center should patient have concerns prior to scheduled return appointment.

## 2015-06-11 NOTE — Progress Notes (Signed)
Subjective:    Patient ID: Marisa [name], female    DOB: [birth year], 82 y.o.   MRN: [id number]  HPI  The patient is a 82 year old female who returns to pain management for further evaluation and treatment of pain involving the neck headaches upper back upper extremity regions lower back and lower extremity regions. The patient was accompanied by her son on today's visit. We discussed patient's condition and patient denied any trauma change in events of daily living the call significant change in symptomatology. The patient stated that she does have some pain of the lower back lower extremity region aggravated by standing and walking with pain becoming more intense as patient spends more time on the feet. The patient stated she was tolerating medications well without undesirable side effects. The patient admits some pain involving the right wrist in the region of prior fracture of the right wrist. The patient denied any significant pain of the left upper extremity. The patient stated that the pain of the lower back lower extremity region was aggravated by twisting turning and stooping maneuvers. We discussed interventional treatment and will avoid interventional treatment and consideration of patient's general medical condition. We will continue medication tramadol and hydrocodone acetaminophen as discussed and as explained to patient in attempt to decrease severity of patient's symptoms, minimize progression of patient's symptoms, and avoid the need for more involved treatment. All agreed to suggested treatment plan. The patient will follow-up with Dr. Yates DecampJohn Walker third for general medical follow-up evaluation as discussed       Review of Systems     Objective:   Physical Exam  There was tenderness to palpation of paraspinal musculature region cervical region cervical facet region palpation which reproduces mild discomfort with mild to moderate tenderness of the splenius capitis and occipitalis  musculature regions on the left as well as on the right. There were no bounding pulsations of the temporal region noted. Over the thoracic facet thoracic paraspinal musculature region was attends to palpation of moderate degree. There was moderate muscle spasms in the upper mid and lower thoracic regions. There was tenderness over the PSIS and PII S region a moderate degree and mild to moderate tenderness of the greater trochanteric region and iliotibial band region. There was superficial varicosities of the lower extremities noted. There was negative clonus negative Homans. EHL strength appeared to be decreased. Straight leg raise was tolerates approximately 20 without increase of pain with dorsiflexion noted. No definite sensory deficit of dermatomal distribution of the knees were tenderness to palpation without increased warmth or erythema of the knees. Crepitus of the knees were noted. No ballottement of the patella was noted. There was negative anterior and posterior drawer signs. Abdomen was protuberant without excessive tends to palpation and no costovertebral tenderness was noted.      Assessment & Plan:      Degenerative disc disease lumbar spine Multilevel degenerative changes lumbar spine lumbar stenosis multiple levels L3-4 L4-5 L1-2 and L2-3. Lumbar facet degenerative changes, foraminal narrowing, ligamentum flavum hypertrophy  Lumbar stenosis with with neurogenic claudication  Lumbar facet syndrome  Greater trochanteric bursitis  Degenerative joint disease of knees  Carpal tunnel syndrome (status post fracture of right wrist)     PLAN   Continue present medication tramadol and hydrocodone acetaminophen as we discussed today  F/U PCP Dr. Yates DecampJohn Walker III  for evaliation of  BP and general medical  condition  F/U surgical evaluation. May consider pending follow-up evaluations . Patient has undergone prior  surgical evaluation and without plans for surgical intervention due  to general medical condition  F/U neurological evaluation. May consider pending follow-up evaluations We will avoid [facility] EMG studies and such studies at this time  May consider radiofrequency rhizolysis or intraspinal procedures pending response to present treatment and F/U evaluation  We will avoid such treatment at this time  Patient to call Pain Management Center should patient have concerns prior to scheduled return appointment.

## 2015-06-11 NOTE — Progress Notes (Signed)
Safety precautions to be maintained throughout the outpatient stay will include: orient to surroundings, keep bed in low position, maintain call bell within reach at all times, provide assistance with transfer out of bed and ambulation.  

## 2015-07-01 ENCOUNTER — Other Ambulatory Visit: Payer: Self-pay | Admitting: Internal Medicine

## 2015-07-01 ENCOUNTER — Ambulatory Visit
Admission: RE | Admit: 2015-07-01 | Discharge: 2015-07-01 | Disposition: A | Discharge status: Home / Self Care | Payer: Medicare Other | Source: Ambulatory Visit | Attending: Internal Medicine | Admitting: Internal Medicine

## 2015-07-01 DIAGNOSIS — M79605 Pain in left leg: Secondary | ICD-10-CM | POA: Insufficient documentation

## 2015-07-09 ENCOUNTER — Ambulatory Visit: Payer: Medicare Other | Attending: Pain Medicine | Admitting: Pain Medicine

## 2015-07-09 ENCOUNTER — Encounter: Payer: Self-pay | Admitting: Pain Medicine

## 2015-07-09 VITALS — BP 156/71 | HR 84 | Temp 98.1°F | Resp 16 | Ht 60.0 in | Wt 200.0 lb

## 2015-07-09 DIAGNOSIS — M5030 Other cervical disc degeneration, unspecified cervical region: Secondary | ICD-10-CM

## 2015-07-09 DIAGNOSIS — G5601 Carpal tunnel syndrome, right upper limb: Secondary | ICD-10-CM | POA: Insufficient documentation

## 2015-07-09 DIAGNOSIS — M51369 Other intervertebral disc degeneration, lumbar region without mention of lumbar back pain or lower extremity pain: Secondary | ICD-10-CM

## 2015-07-09 DIAGNOSIS — M7062 Trochanteric bursitis, left hip: Secondary | ICD-10-CM

## 2015-07-09 DIAGNOSIS — M170 Bilateral primary osteoarthritis of knee: Secondary | ICD-10-CM | POA: Diagnosis not present

## 2015-07-09 DIAGNOSIS — M79606 Pain in leg, unspecified: Secondary | ICD-10-CM | POA: Diagnosis present

## 2015-07-09 DIAGNOSIS — G5603 Carpal tunnel syndrome, bilateral upper limbs: Secondary | ICD-10-CM

## 2015-07-09 DIAGNOSIS — M5136 Other intervertebral disc degeneration, lumbar region: Secondary | ICD-10-CM | POA: Diagnosis not present

## 2015-07-09 DIAGNOSIS — M7060 Trochanteric bursitis, unspecified hip: Secondary | ICD-10-CM | POA: Diagnosis not present

## 2015-07-09 DIAGNOSIS — M4806 Spinal stenosis, lumbar region: Secondary | ICD-10-CM | POA: Diagnosis not present

## 2015-07-09 DIAGNOSIS — Z8781 Personal history of (healed) traumatic fracture: Secondary | ICD-10-CM | POA: Diagnosis not present

## 2015-07-09 DIAGNOSIS — M19011 Primary osteoarthritis, right shoulder: Secondary | ICD-10-CM

## 2015-07-09 DIAGNOSIS — M7061 Trochanteric bursitis, right hip: Secondary | ICD-10-CM

## 2015-07-09 DIAGNOSIS — M545 Low back pain: Secondary | ICD-10-CM | POA: Diagnosis present

## 2015-07-09 DIAGNOSIS — M48062 Spinal stenosis, lumbar region with neurogenic claudication: Secondary | ICD-10-CM

## 2015-07-09 DIAGNOSIS — M19012 Primary osteoarthritis, left shoulder: Secondary | ICD-10-CM

## 2015-07-09 DIAGNOSIS — M47816 Spondylosis without myelopathy or radiculopathy, lumbar region: Secondary | ICD-10-CM | POA: Insufficient documentation

## 2015-07-09 DIAGNOSIS — G5600 Carpal tunnel syndrome, unspecified upper limb: Secondary | ICD-10-CM

## 2015-07-09 DIAGNOSIS — M533 Sacrococcygeal disorders, not elsewhere classified: Secondary | ICD-10-CM

## 2015-07-09 MED ORDER — TRAMADOL HCL 50 MG PO TABS: ORAL_TABLET | ORAL | Status: DC

## 2015-07-09 MED ORDER — HYDROCODONE-ACETAMINOPHEN 5-325 MG PO TABS: ORAL_TABLET | ORAL | Status: DC

## 2015-07-09 NOTE — Patient Instructions (Addendum)
PLAN   Continue present medication tramadol and hydrocodone acetaminophen as we discussed today  F/U PCP Dr. Yates DecampJohn Walker [name]  for evaliation of  BP and general medical  condition  F/U surgical evaluation. May consider further evaluation of lumbar and lower extremity pain pending follow-up evaluations . Patient has undergone prior surgical evaluation and without plans for surgical intervention due to general medical condition  MRI and surgical evaluation of shoulder as discussed  F/U neurological evaluation. May consider pending follow-up evaluations We will avoid [facility] EMG studies and such studies at this time  May consider radiofrequency rhizolysis or intraspinal procedures pending response to present treatment and F/U evaluation  We will avoid such treatment at this time  Patient to call Pain Management Center should patient have concerns prior to scheduled return appointment.Pain Management Discharge Instructions  General Discharge Instructions :  If you need to reach your doctor call: Monday-Friday 8:00 am - 4:00 pm at [phone] or toll free [phone].  After clinic hours [phone] to have operator reach doctor.  Bring all of your medication bottles to all your appointments in the pain clinic.  To cancel or reschedule your appointment with Pain Management please remember to call 24 hours in advance to avoid a fee.  Refer to the educational materials which you have been given on: General Risks, I had my Procedure. Discharge Instructions, Post Sedation.  Post Procedure Instructions:  The drugs you were given will stay in your system until tomorrow, so for the next 24 hours you should not drive, make any legal decisions or drink any alcoholic beverages.  You may eat anything you prefer, but it is better to start with liquids then soups and crackers, and gradually work up to solid foods.  Please notify your doctor immediately if you have any unusual bleeding, trouble  breathing or pain that is not related to your normal pain.  Depending on the type of procedure that was done, some parts of your body may feel week and/or numb.  This usually clears up by tonight or the next day.  Walk with the use of an assistive device or accompanied by an adult for the 24 hours.  You may use ice on the affected area for the first 24 hours.  Put ice in a Ziploc bag and cover with a towel and place against area 15 minutes on 15 minutes off.  You may switch to heat after 24 hours.

## 2015-07-09 NOTE — Progress Notes (Signed)
Subjective:    Patient ID: Marisa [name], female    DOB: July 10, 1923, 82 y.o.   MRN: [id number]  HPI  The patient is a 82 year old female who returns to pain management for further evaluation and treatment of pain involving the lower back and lower extremity region predominantly. The patient recently fell and has pain involving the right shoulder. We discussed patient's condition with the patient and patient's daughter and decision has been made to schedule patient for MRI of right shoulder as well as orthopedic evaluation of right shoulder. The patient is with severely limited range of motion of the shoulder and is with pain involving the shoulder. We will proceed with scheduling MRI of the right shoulder as well as orthopedic evaluation of the right shoulder at this time. The patient also underwent evaluation of the lower extremities to evaluate patient for thrombophlebitis. The patient was without significant abnormalities noted according to the patient and patient's daughter. At the present time we will continue tramadol and hydrocodone acetaminophen and will await results of studies. The patient was with understanding and in agreement with suggested treatment plan. The patient will follow-up with Dr. Dan Humphreys this week for further assessment of condition as discussed and for evaluation of the lower extremity swelling in addition to discuss patient results of MRI of the shoulder and surgical evaluation of the shoulder. All agreed to suggested treatment plan      Review of Systems     Objective:   Physical Exam   There was tenderness to palpation of the cervical facet cervical paraspinal musculature region of mild degree. Palpation over the region of the acromioclavicular and glenohumeral joint region was a severe tenderness to palpation on the right compared to the left. The patient was unable to ABduct right shoulder to 60. There was pain with manipulation of the shoulder. Patient was with  decreased grip strength on the right compared to the left with Tinel and Phalen's maneuver reproducing moderate discomfort. Please note the patient is status post fracture of the right wrist as well. There was tenderness over the thoracic facet thoracic paraspinal musculature region with evidence of muscle spasms occurring in the upper mid and lower thoracic region with no crepitus of the thoracic region noted. Palpation over the lumbar paraspinal musculature region lumbar facet region was attends to palpation of moderate degree with palpation over the PSIS and PII S region reproducing moderate discomfort. Straight leg raising was tolerated to 20 without increased pain with dorsiflexion noted. There was noted to be superficial varicosities of the lower extremities without increased warmth erythema of the lower extremities noted with 1+ edema of the lower extremities. There was negative clonus negative Homans. EHL strength appeared to be decreased. No definite sensory deficit or dermatomal dystrophy detected. Abdomen nontender with no costovertebral tenderness noted.     Assessment & Plan:   Degenerative joint disease of right shoulder with recent traum Evaluate for rotator cuff abnormalities and other abnormalities  Degenerative disc disease lumbar spine Multilevel degenerative changes lumbar spine lumbar stenosis multiple levels L3-4 L4-5 L1-2 and L2-3. Lumbar facet degenerative changes, foraminal narrowing, ligamentum flavum hypertrophy  Lumbar stenosis with with neurogenic claudication  Lumbar facet syndrome  Greater trochanteric bursitis  Degenerative joint disease of knees  Carpal tunnel syndrome (status post fracture of right wrist)     PLAN   Continue present medication tramadol and hydrocodone acetaminophen as we discussed today  F/U PCP Dr. [name] III  for evaliation of  BP and general  medical  condition  F/U surgical evaluation. May consider further evaluation of lumbar  and lower extremity pain pending follow-up evaluations . Patient has undergone prior surgical evaluation and without plans for surgical intervention due to general medical condition  MRI and surgical evaluation of shoulder as discussed  F/U neurological evaluation. May consider pending follow-up evaluations We will avoid [facility] EMG studies and such studies at this time  May consider radiofrequency rhizolysis or intraspinal procedures pending response to present treatment and F/U evaluation  We will avoid such treatment at this time  Patient to call Pain Management Center should patient have concerns prior to scheduled return appointment.

## 2015-07-09 NOTE — Progress Notes (Signed)
Safety precautions to be maintained throughout the outpatient stay will include: orient to surroundings, keep bed in low position, maintain call bell within reach at all times, provide assistance with transfer out of bed and ambulation.  Pt fell on 07/03/15 washing clothes- no family noted fall- Pt unsure of which way she fell- hurt right shoulder arm--unable to lift to dress etc- Dr Dan HumphreysWalker order home OT PT- home care for pt

## 2015-07-25 ENCOUNTER — Telehealth: Payer: Self-pay | Admitting: Pain Medicine

## 2015-07-25 NOTE — Telephone Encounter (Signed)
[facility] will be taking over getting meds to patient and they will need all new scripts in order to do this, they will be faxing information regarding this. Call if there are any questions

## 2015-07-25 NOTE — Telephone Encounter (Signed)
Thank you very much 

## 2015-07-25 NOTE — Telephone Encounter (Signed)
New pharmacy called and informed that Dr. Metta Clinesrisp will write new rx at next eval appointment. Patient may start using pharmacy of her choice at next visit with next script. No new scripts at this time.

## 2015-08-01 ENCOUNTER — Other Ambulatory Visit: Payer: Self-pay | Admitting: Pain Medicine

## 2015-08-01 ENCOUNTER — Ambulatory Visit
Admission: RE | Admit: 2015-08-01 | Discharge: 2015-08-01 | Disposition: A | Discharge status: Home / Self Care | Payer: Medicare Other | Source: Ambulatory Visit | Attending: Pain Medicine | Admitting: Pain Medicine

## 2015-08-01 DIAGNOSIS — M19011 Primary osteoarthritis, right shoulder: Secondary | ICD-10-CM

## 2015-08-01 DIAGNOSIS — M5030 Other cervical disc degeneration, unspecified cervical region: Secondary | ICD-10-CM | POA: Insufficient documentation

## 2015-08-01 DIAGNOSIS — M25511 Pain in right shoulder: Secondary | ICD-10-CM | POA: Diagnosis present

## 2015-08-01 DIAGNOSIS — M4806 Spinal stenosis, lumbar region: Secondary | ICD-10-CM | POA: Diagnosis not present

## 2015-08-01 DIAGNOSIS — M5136 Other intervertebral disc degeneration, lumbar region: Secondary | ICD-10-CM

## 2015-08-01 DIAGNOSIS — M7551 Bursitis of right shoulder: Secondary | ICD-10-CM | POA: Diagnosis not present

## 2015-08-01 DIAGNOSIS — M19012 Primary osteoarthritis, left shoulder: Secondary | ICD-10-CM | POA: Diagnosis not present

## 2015-08-01 DIAGNOSIS — G5603 Carpal tunnel syndrome, bilateral upper limbs: Secondary | ICD-10-CM | POA: Diagnosis not present

## 2015-08-01 DIAGNOSIS — S46009A Unspecified injury of muscle(s) and tendon(s) of the rotator cuff of unspecified shoulder, initial encounter: Secondary | ICD-10-CM | POA: Insufficient documentation

## 2015-08-01 DIAGNOSIS — G5600 Carpal tunnel syndrome, unspecified upper limb: Secondary | ICD-10-CM

## 2015-08-01 DIAGNOSIS — S46001A Unspecified injury of muscle(s) and tendon(s) of the rotator cuff of right shoulder, initial encounter: Secondary | ICD-10-CM

## 2015-08-01 DIAGNOSIS — M170 Bilateral primary osteoarthritis of knee: Secondary | ICD-10-CM | POA: Insufficient documentation

## 2015-08-01 DIAGNOSIS — M75121 Complete rotator cuff tear or rupture of right shoulder, not specified as traumatic: Secondary | ICD-10-CM | POA: Insufficient documentation

## 2015-08-01 DIAGNOSIS — M7061 Trochanteric bursitis, right hip: Secondary | ICD-10-CM

## 2015-08-01 DIAGNOSIS — M533 Sacrococcygeal disorders, not elsewhere classified: Secondary | ICD-10-CM | POA: Diagnosis not present

## 2015-08-01 DIAGNOSIS — M25411 Effusion, right shoulder: Secondary | ICD-10-CM | POA: Insufficient documentation

## 2015-08-01 DIAGNOSIS — M48062 Spinal stenosis, lumbar region with neurogenic claudication: Secondary | ICD-10-CM

## 2015-08-01 DIAGNOSIS — M7062 Trochanteric bursitis, left hip: Secondary | ICD-10-CM

## 2015-08-01 DIAGNOSIS — W19XXXA Unspecified fall, initial encounter: Secondary | ICD-10-CM | POA: Insufficient documentation

## 2015-08-01 DIAGNOSIS — M51369 Other intervertebral disc degeneration, lumbar region without mention of lumbar back pain or lower extremity pain: Secondary | ICD-10-CM

## 2015-08-01 DIAGNOSIS — S4991XA Unspecified injury of right shoulder and upper arm, initial encounter: Secondary | ICD-10-CM | POA: Diagnosis present

## 2015-08-01 DIAGNOSIS — M19019 Primary osteoarthritis, unspecified shoulder: Secondary | ICD-10-CM | POA: Insufficient documentation

## 2015-08-02 ENCOUNTER — Telehealth: Payer: Self-pay | Admitting: *Deleted

## 2015-08-02 NOTE — Telephone Encounter (Signed)
Spoke with Maurine Ministerennis and he wanted me to call Mrs Ghanem's daughter, Valda FaviaConnie Baginski to let her know of her mother's MRI results and Dr Metta Clinesrisp recommendation.  She states they already have an appt with a neuro surgeon on Monday and another medication management appt with Dr Metta Clinesrisp on Tuesday.

## 2015-08-02 NOTE — Progress Notes (Signed)
Patient's daughter notified.

## 2015-08-06 ENCOUNTER — Encounter: Payer: Self-pay | Admitting: Pain Medicine

## 2015-08-06 ENCOUNTER — Ambulatory Visit: Payer: Medicare Other | Attending: Pain Medicine | Admitting: Pain Medicine

## 2015-08-06 VITALS — BP 165/56 | HR 70 | Temp 96.2°F | Resp 20 | Ht 60.0 in | Wt 218.0 lb

## 2015-08-06 DIAGNOSIS — M533 Sacrococcygeal disorders, not elsewhere classified: Secondary | ICD-10-CM

## 2015-08-06 DIAGNOSIS — G5600 Carpal tunnel syndrome, unspecified upper limb: Secondary | ICD-10-CM

## 2015-08-06 DIAGNOSIS — M25511 Pain in right shoulder: Secondary | ICD-10-CM | POA: Diagnosis present

## 2015-08-06 DIAGNOSIS — M47816 Spondylosis without myelopathy or radiculopathy, lumbar region: Secondary | ICD-10-CM | POA: Diagnosis not present

## 2015-08-06 DIAGNOSIS — G5603 Carpal tunnel syndrome, bilateral upper limbs: Secondary | ICD-10-CM

## 2015-08-06 DIAGNOSIS — M5136 Other intervertebral disc degeneration, lumbar region: Secondary | ICD-10-CM | POA: Insufficient documentation

## 2015-08-06 DIAGNOSIS — M7062 Trochanteric bursitis, left hip: Secondary | ICD-10-CM

## 2015-08-06 DIAGNOSIS — M19011 Primary osteoarthritis, right shoulder: Secondary | ICD-10-CM | POA: Diagnosis not present

## 2015-08-06 DIAGNOSIS — M7060 Trochanteric bursitis, unspecified hip: Secondary | ICD-10-CM | POA: Diagnosis not present

## 2015-08-06 DIAGNOSIS — M170 Bilateral primary osteoarthritis of knee: Secondary | ICD-10-CM | POA: Diagnosis not present

## 2015-08-06 DIAGNOSIS — M5030 Other cervical disc degeneration, unspecified cervical region: Secondary | ICD-10-CM

## 2015-08-06 DIAGNOSIS — M7061 Trochanteric bursitis, right hip: Secondary | ICD-10-CM

## 2015-08-06 DIAGNOSIS — M4806 Spinal stenosis, lumbar region: Secondary | ICD-10-CM | POA: Diagnosis not present

## 2015-08-06 DIAGNOSIS — G5601 Carpal tunnel syndrome, right upper limb: Secondary | ICD-10-CM | POA: Diagnosis not present

## 2015-08-06 DIAGNOSIS — M545 Low back pain: Secondary | ICD-10-CM | POA: Diagnosis present

## 2015-08-06 DIAGNOSIS — M48062 Spinal stenosis, lumbar region with neurogenic claudication: Secondary | ICD-10-CM

## 2015-08-06 MED ORDER — DICLOFENAC SODIUM 1 % TD GEL: TRANSDERMAL | Status: AC

## 2015-08-06 MED ORDER — HYDROCODONE-ACETAMINOPHEN 5-325 MG PO TABS: ORAL_TABLET | ORAL | Status: DC

## 2015-08-06 NOTE — Progress Notes (Signed)
Subjective:    Patient ID: Marisa [name], female    DOB: Jun 04, 1923, 82 y.o.   MRN: [id number]  HPI  The patient is a 82 year old female who returns to pain management for further evaluation and treatment of pain involving the lower back lower extremity region and region of the right shoulder. MRI of the right shoulder has revealed patient to be with significant tears and other abnormalities of the shoulder. The patient has undergone injection of the shoulder by [name] . At the present time patient has some relief of pain and has improved range of motion of the shoulder. The patient is undergone evaluation by Dr.[name] and is without plans for surgical intervention of the shoulder at this time. The patient will continue exercise program and physical therapy treatment as discussed. We will continue patient's, and all and hydrocodone acetaminophen and we will remain available to consider modification of treatment regimen pending follow-up evaluation and response to treatment. All agreed to suggested treatment plan patient continues to have low back lower extremity pain which is fairly well-controlled at this time. We have cautioned patient regarding activities to avoid aggravation of patient's All agreed with suggested treatment plan      Review of Systems     Objective:   Physical Exam   There was tenderness to palpation of the splenius capitis and occipitalis musculature region a mild to moderate degree. There was mild to moderate tenderness over the cervical facet cervical paraspinal musculature region as well as the thoracic facet thoracic paraspinal musculature region. Palpation of the acromioclavicular and glenohumeral joint regions reproduced moderate discomfort. There was limited range of motion of the shoulder. Patient appeared to be with unremarkable Spurling's maneuver. There was decreased grip strength. There was well-healed surgical scar of the right wrist and Tinel and Phalen's  maneuver reproduced moderate discomfort. Palpation over the lumbar paraspinal muscular treat and lumbar facet region was with moderate tenderness to palpation. Palpation of the PSIS and PII S region reproduces moderate discomfort. Straight leg raise was tolerates approximately 20 without increased pain with dorsiflexion noted. There was moderate tenderness of the greater trochanteric region iliotibial band region. Superficial varicosities were noted of the lower extremities there was negative clonus negative Homans. There were well-healed surgical scar of the knees noted no definite sensory deficit or dermatomal distribution detected. There was negative clonus negative Homans abdomen protuberant and nontender. No costovertebral tenderness noted     Assessment & Plan:     Degenerative joint disease of right shoulder with recent traum Evaluate for rotator cuff abnormalities and other abnormalities  Degenerative disc disease lumbar spine Multilevel degenerative changes lumbar spine lumbar stenosis multiple levels L3-4 L4-5 L1-2 and L2-3. Lumbar facet degenerative changes, foraminal narrowing, ligamentum flavum hypertrophy  Lumbar stenosis with with neurogenic claudication  Lumbar facet syndrome  Greater trochanteric bursitis  Degenerative joint disease of knees  Carpal tunnel syndrome (status post fracture of right wrist)     PLAN   Continue present medication tramadol and hydrocodone acetaminophen and begin Voltaren gel applications to shoulder as we discussed today  F/U PCP Dr. Yates DecampJohn Walker III  for evaliation of  BP and general medical  Condition.   Follow-up [employer] status post injection of shoulder as needed  F/U surgical evaluation. May consider further evaluation of lumbar and lower extremity pain pending follow-up evaluations . Patient has undergone prior surgical evaluation and without plans for surgical intervention due to general medical condition  F/U Dr. Joice LoftsPoggi for  follow-up evaluation  of right shoulder as planned   F/U neurological evaluation. May consider pending follow-up evaluations We will avoid [facility] EMG studies and such studies at this time  May consider radiofrequency rhizolysis or intraspinal procedures pending response to present treatment and F/U evaluation  We will avoid such treatment at this time  Patient to call Pain Management Center should patient have concerns prior to scheduled return appointment.

## 2015-08-06 NOTE — Patient Instructions (Addendum)
PLAN   Continue present medication tramadol and hydrocodone acetaminophen and begin Voltaren gel applications to shoulder as we discussed today  F/U PCP Dr. Yates DecampJohn Walker III  for evaliation of  BP and general medical  condition. Follow-up [employer] as needed  F/U surgical evaluation. May consider further evaluation of lumbar and lower extremity pain pending follow-up evaluations . Patient has undergone prior surgical evaluation and without plans for surgical intervention due to general medical condition  F/U Dr. Joice LoftsPoggi for follow-up evaluation of right shoulder as planned   F/U neurological evaluation. May consider pending follow-up evaluations We will avoid [facility] EMG studies and such studies at this time  May consider radiofrequency rhizolysis or intraspinal procedures pending response to present treatment and F/U evaluation  We will avoid such treatment at this time  Patient to call Pain Management Center should patient have concerns prior to scheduled return appointment.Pain Management Discharge Instructions  General Discharge Instructions :  If you need to reach your doctor call: Monday-Friday 8:00 am - 4:00 pm at [phone] or toll free [phone].  After clinic hours [phone] to have operator reach doctor.  Bring all of your medication bottles to all your appointments in the pain clinic.  To cancel or reschedule your appointment with Pain Management please remember to call 24 hours in advance to avoid a fee.  Refer to the educational materials which you have been given on: General Risks, I had my Procedure. Discharge Instructions, Post Sedation.  Post Procedure Instructions:  The drugs you were given will stay in your system until tomorrow, so for the next 24 hours you should not drive, make any legal decisions or drink any alcoholic beverages.  You may eat anything you prefer, but it is better to start with liquids then soups and crackers, and gradually work up to solid  foods.  Please notify your doctor immediately if you have any unusual bleeding, trouble breathing or pain that is not related to your normal pain.  Depending on the type of procedure that was done, some parts of your body may feel week and/or numb.  This usually clears up by tonight or the next day.  Walk with the use of an assistive device or accompanied by an adult for the 24 hours.  You may use ice on the affected area for the first 24 hours.  Put ice in a Ziploc bag and cover with a towel and place against area 15 minutes on 15 minutes off.  You may switch to heat after 24 hours.

## 2015-08-06 NOTE — Progress Notes (Signed)
Safety precautions to be maintained throughout the outpatient stay will include: orient to surroundings, keep bed in low position, maintain call bell within reach at all times, provide assistance with transfer out of bed and ambulation.  

## 2015-08-14 LAB — TOXASSURE SELECT 13 (MW), URINE

## 2015-08-14 NOTE — Progress Notes (Signed)
Quick Note:  Reviewed. ______ 

## 2015-09-02 ENCOUNTER — Encounter: Payer: Medicare Other | Attending: Surgery | Admitting: Surgery

## 2015-09-02 DIAGNOSIS — X58XXXA Exposure to other specified factors, initial encounter: Secondary | ICD-10-CM | POA: Diagnosis not present

## 2015-09-02 DIAGNOSIS — J449 Chronic obstructive pulmonary disease, unspecified: Secondary | ICD-10-CM | POA: Diagnosis not present

## 2015-09-02 DIAGNOSIS — L0232 Furuncle of buttock: Secondary | ICD-10-CM | POA: Insufficient documentation

## 2015-09-02 DIAGNOSIS — I509 Heart failure, unspecified: Secondary | ICD-10-CM | POA: Diagnosis not present

## 2015-09-02 DIAGNOSIS — I110 Hypertensive heart disease with heart failure: Secondary | ICD-10-CM | POA: Insufficient documentation

## 2015-09-02 DIAGNOSIS — S31819A Unspecified open wound of right buttock, initial encounter: Secondary | ICD-10-CM | POA: Insufficient documentation

## 2015-09-02 NOTE — Progress Notes (Addendum)
Gordy SaversWOOTEN, Apryle R. ([id number]) Visit Report for 09/02/2015 Chief Complaint Document Details Patient Name: Marisa SchleinWOOTEN, Lunell R. Date of Service: 09/02/2015 1:30 PM Medical Record Number: [id number] Patient Account Number: [id number] Date of Birth/Sex: 1924-03-08 (82 y.o. Female) Treating RN: Huel CoventryWoody, Kim Primary Care Physician: Jodi MourningWalker III, John Other Clinician: Referring Physician: Jodi MourningWalker III, John Treating Physician/Extender: Rudene ReBritto, Edrik Rundle Weeks in Treatment: 0 Information Obtained from: Patient Chief Complaint Patient presents to the wound care center for a consult due non healing wound to the right gluteal area which she's had for about 3 weeks. Electronic Signature(s) Signed: 09/02/2015 3:41:54 PM By: Evlyn KannerBritto, Vernestine Brodhead MD, FACS Entered By: Evlyn KannerBritto, Xochitl Egle on 09/02/2015 15:41:54 Marisa [name], [name] ([id number]) -------------------------------------------------------------------------------- HPI Details Patient Name: Marisa SchleinWOOTEN, Chrisy R. Date of Service: 09/02/2015 1:30 PM Medical Record Number: [id number] Patient Account Number: [id number] Date of Birth/Sex: 1924-03-08 (82 y.o. Female) Treating RN: Huel CoventryWoody, Kim Primary Care Physician: Jodi MourningWalker III, John Other Clinician: Referring Physician: Jodi MourningWalker III, John Treating Physician/Extender: Rudene ReBritto, Mckenlee Mangham Weeks in Treatment: 0 History of Present Illness Location: right gluteal area ulcer Quality: Patient reports experiencing a dull pain to affected area(s). Severity: Patient states wound are getting worse. Duration: Patient has had the wound for < 3 weeks prior to presenting for treatment Timing: Pain in wound is Intermittent (comes and goes Context: The wound would happen gradually Modifying Factors: Other treatment(s) tried include:Diflucan and some Lotrisone cream Associated Signs and Symptoms: Patient reports having difficulty sitting for long periods. HPI Description: 82 year old patient who was seen by the PA at the internal medicine practice  [facility]. The patient has been referred to others with what looks like a fungal infection in the perineal area with some open wound which was referred for an opinion. The patient's past medical history significant for hiatal hernia, osteoarthritis, status post appendectomy, cholecystectomy, knee arthroscopy. The patient is not been a smoker. The patient was put on Diflucan 100 mg daily for 3 days and was asked to use Lotrisone cream twice daily. Moisture barrier was also recommended and off loading was suggested. the ulcerated areas on the right gluteal region and started off as a bump and then opened out into an ulcer. Electronic Signature(s) Signed: 09/02/2015 3:47:16 PM By: Evlyn KannerBritto, Azadeh Hyder MD, FACS Previous Signature: 09/02/2015 1:49:34 PM Version By: Evlyn KannerBritto, Peri Kreft MD, FACS Entered By: Evlyn KannerBritto, Chima Astorino on 09/02/2015 15:47:16 Moshier, [name] ([id number]) -------------------------------------------------------------------------------- Physical Exam Details Patient Name: Marisa [name], [name] Date of Service: 09/02/2015 1:30 PM Medical Record Number: [id number] Patient Account Number: [id number] Date of Birth/Sex: 1924-03-08 (82 y.o. Female) Treating RN: Huel CoventryWoody, Kim Primary Care Physician: Jodi MourningWalker III, John Other Clinician: Referring Physician: Jodi MourningWalker III, John Treating Physician/Extender: Rudene ReBritto, Cambelle Suchecki Weeks in Treatment: 0 Constitutional . Pulse regular. Respirations normal and unlabored. Afebrile. . Eyes Nonicteric. Reactive to light. Ears, Nose, Mouth, and Throat Lips, teeth, and gums WNL.Marland Kitchen. Moist mucosa without lesions. Neck supple and nontender. No palpable supraclavicular or cervical adenopathy. Normal sized without goiter. Respiratory WNL. No retractions.. Cardiovascular Pedal Pulses WNL. No clubbing, cyanosis or edema. Gastrointestinal (GI) Abdomen without masses or tenderness.. No liver or spleen enlargement or tenderness.. Lymphatic No adneopathy. No adenopathy. No  adenopathy. Musculoskeletal Adexa without tenderness or enlargement.. Digits and nails w/o clubbing, cyanosis, infection, petechiae, ischemia, or inflammatory conditions.. Integumentary (Hair, Skin) No suspicious lesions. No crepitus or fluctuance. No peri-wound warmth or erythema. No masses.Marland Kitchen. Psychiatric Judgement and insight Intact.. No evidence of depression, anxiety, or agitation.. Notes on the right gluteal area approximately 8 cm from the anal verge she has a punched-out  ulcerated area with minimal slough at the base. There is no surrounding cellulitis or abscess. No debridement required today. Electronic Signature(s) Signed: 09/02/2015 3:47:55 PM By: Evlyn Kanner MD, FACS Entered By: Evlyn Kanner on 09/02/2015 15:47:54 Marisa [name], [name] ([id number]) -------------------------------------------------------------------------------- Physician Orders Details Patient Name: Marisa [name], Princesa R. Date of Service: 09/02/2015 1:30 PM Medical Record Number: [id number] Patient Account Number: [id number] Date of Birth/Sex: [birth year] (82 y.o. Female) Treating RN: Huel Coventry Primary Care Physician: Jodi Mourning Other Clinician: Referring Physician: Jodi Mourning Treating Physician/Extender: Rudene Re in Treatment: 0 Verbal / Phone Orders: Yes Clinician: Huel Coventry Read Back and Verified: Yes Diagnosis Coding Wound Cleansing Wound #1 Left Gluteus o Clean wound with Normal Saline. Anesthetic Wound #1 Left Gluteus o Topical Lidocaine 4% cream applied to wound bed prior to debridement Primary Wound Dressing Wound #1 Left Gluteus o Aquacel Ag Secondary Dressing Wound #1 Left Gluteus o Boardered Foam Dressing Dressing Change Frequency Wound #1 Left Gluteus o Change Dressing Monday, Wednesday, Friday - Monday in wound care center Follow-up Appointments Wound #1 Left Gluteus o Return Appointment in 1 week. Off-Loading Wound #1 Left Gluteus o Turn and  reposition every 2 hours Additional Orders / Instructions Wound #1 Left Gluteus o Increase protein intake. Home Health Wound #1 Left Gluteus JEZLYN, WESTERFIELD R. ([id number]) o Continue Home Health Visits o Home Health Nurse may visit PRN to address patientos wound care needs. o FACE TO FACE ENCOUNTER: MEDICARE and MEDICAID PATIENTS: I certify that this patient is under my care and that I had a face-to-face encounter that meets the physician face-to-face encounter requirements with this patient on this date. The encounter with the patient was in whole or in part for the following MEDICAL CONDITION: (primary reason for Home Healthcare) MEDICAL NECESSITY: I certify, that based on my findings, NURSING services are a medically necessary home health service. HOME BOUND STATUS: I certify that my clinical findings support that this patient is homebound (i.e., Due to illness or injury, pt requires aid of supportive devices such as crutches, cane, wheelchairs, walkers, the use of special transportation or the assistance of another person to leave their place of residence. There is a normal inability to leave the home and doing so requires considerable and taxing effort. Other absences are for medical reasons / religious services and are infrequent or of short duration when for other reasons). o If current dressing causes regression in wound condition, may D/C ordered dressing product/s and apply Normal Saline Moist Dressing daily until next Wound Healing Center / Other MD appointment. Notify Wound Healing Center of regression in wound condition at [phone]. Electronic Signature(s) Signed: 09/02/2015 4:16:32 PM By: Evlyn Kanner MD, FACS Signed: 09/02/2015 5:25:15 PM By: Elliot Gurney RN, BSN, Kim RN, BSN Entered By: Elliot Gurney, RN, BSN, Kim on 09/02/2015 14:47:57 Call, Charlii R. ([id number]) -------------------------------------------------------------------------------- Problem List Details Patient  Name: Robards, Rudie R. Date of Service: 09/02/2015 1:30 PM Medical Record Number: [id number] Patient Account Number: [id number] Date of Birth/Sex: 1923/08/09 (82 y.o. Female) Treating RN: Huel Coventry Primary Care Physician: Jodi Mourning Other Clinician: Referring Physician: Jodi Mourning Treating Physician/Extender: Rudene Re in Treatment: 0 Active Problems ICD-10 Encounter Code Description Active Date Diagnosis S31.819A Unspecified open wound of right buttock, initial encounter 09/02/2015 Yes L02.32 Furuncle of buttock 09/02/2015 Yes Inactive Problems Resolved Problems Electronic Signature(s) Signed: 09/02/2015 3:41:26 PM By: Evlyn Kanner MD, FACS Entered By: Evlyn Kanner on 09/02/2015 15:41:26 Carlo, Sabrina R. ([id number]) -------------------------------------------------------------------------------- Progress Note Details Patient Name: Marisa [name], Ercelle  R. Date of Service: 09/02/2015 1:30 PM Medical Record Number: [id number] Patient Account Number: [id number] Date of Birth/Sex: September 15, 1923 (82 y.o. Female) Treating RN: Huel Coventry Primary Care Physician: Jodi Mourning Other Clinician: Referring Physician: Jodi Mourning Treating Physician/Extender: Rudene Re in Treatment: 0 Subjective Chief Complaint Information obtained from Patient Patient presents to the wound care center for a consult due non healing wound to the right gluteal area which she's had for about 3 weeks. History of Present Illness (HPI) The following HPI elements were documented for the patient's wound: Location: right gluteal area ulcer Quality: Patient reports experiencing a dull pain to affected area(s). Severity: Patient states wound are getting worse. Duration: Patient has had the wound for < 3 weeks prior to presenting for treatment Timing: Pain in wound is Intermittent (comes and goes Context: The wound would happen gradually Modifying Factors: Other treatment(s) tried  include:Diflucan and some Lotrisone cream Associated Signs and Symptoms: Patient reports having difficulty sitting for long periods. 82 year old patient who was seen by the PA at the internal medicine practice [facility]. The patient has been referred to others with what looks like a fungal infection in the perineal area with some open wound which was referred for an opinion. The patient's past medical history significant for hiatal hernia, osteoarthritis, status post appendectomy, cholecystectomy, knee arthroscopy. The patient is not been a smoker. The patient was put on Diflucan 100 mg daily for 3 days and was asked to use Lotrisone cream twice daily. Moisture barrier was also recommended and off loading was suggested. the ulcerated areas on the right gluteal region and started off as a bump and then opened out into an ulcer. Wound History Patient presents with 1 open wound that has been present for approximately 6 weeks. Patient has been treating wound in the following manner: cream, medication, neosporin. Laboratory tests have been performed in the last month. Patient reportedly has not tested positive for an antibiotic resistant organism. Patient reportedly has not tested positive for osteomyelitis. Patient reportedly has not had testing performed to evaluate circulation in the legs. Patient History Information obtained from Patient. Allergies sulfur, PCN Marisa [name], [name] ([id number]) Family History Cancer - Child, Heart Disease - Siblings, Hypertension - Child, No family history of Diabetes, Kidney Disease, Lung Disease, Seizures, Stroke, Thyroid Problems. Social History Never smoker, Marital Status - Widowed, Alcohol Use - Never, Drug Use - No History, Caffeine Use - Daily. Medical History Eyes Denies history of Cataracts, Glaucoma Ear/Nose/Mouth/Throat Denies history of Chronic sinus problems/congestion, Middle ear problems Hematologic/Lymphatic Denies history of Anemia,  Hemophilia, Human Immunodeficiency Virus, Lymphedema, Sickle Cell Disease Respiratory Patient has history of Chronic Obstructive Pulmonary Disease (COPD) Denies history of Aspiration, Asthma, Pneumothorax, Sleep Apnea, Tuberculosis Cardiovascular Patient has history of Congestive Heart Failure, Hypertension Denies history of Angina, Arrhythmia, Coronary Artery Disease, Deep Vein Thrombosis, Hypotension, Myocardial Infarction, Peripheral Arterial Disease, Peripheral Venous Disease, Phlebitis, Vasculitis Gastrointestinal Denies history of Cirrhosis , Colitis, Crohn s, Hepatitis A, Hepatitis B, Hepatitis C Endocrine Denies history of Type I Diabetes, Type II Diabetes Genitourinary Denies history of End Stage Renal Disease Immunological Denies history of Lupus Erythematosus, Raynaud s, Scleroderma Integumentary (Skin) Denies history of History of Burn, History of pressure wounds Musculoskeletal Patient has history of Osteoarthritis Denies history of Gout, Rheumatoid Arthritis, Osteomyelitis Neurologic Denies history of Dementia, Neuropathy, Quadriplegia, Paraplegia, Seizure Disorder Oncologic Denies history of Received Chemotherapy, Received Radiation Psychiatric Denies history of Anorexia/bulimia, Confinement Anxiety Review of Systems (ROS) Constitutional Symptoms (General Health) The patient  has no complaints or symptoms. Eyes Complains or has symptoms of Glasses / Contacts. Denies complaints or symptoms of Dry Eyes, Vision Changes. Ear/Nose/Mouth/Throat The patient has no complaints or symptoms. Moseman, Blaike R. ([id number]) Hematologic/Lymphatic The patient has no complaints or symptoms. Respiratory The patient has no complaints or symptoms. Cardiovascular Complains or has symptoms of LE edema. Denies complaints or symptoms of Chest pain. Gastrointestinal The patient has no complaints or symptoms. Endocrine Complains or has symptoms of Thyroid disease. Genitourinary The  patient has no complaints or symptoms. Immunological The patient has no complaints or symptoms. Integumentary (Skin) Complains or has symptoms of Wounds, Bleeding or bruising tendency, Breakdown. Musculoskeletal The patient has no complaints or symptoms. Neurologic The patient has no complaints or symptoms. Oncologic The patient has no complaints or symptoms. Psychiatric The patient has no complaints or symptoms. Medications tramadol 50 mg tablet oral 1 1 tablet oral two to five times per day Claritin 10 mg tablet oral 1 1 tablet oral daily pravastatin 40 mg tablet oral 1 1 tablet oral daily benazepril 40 mg tablet oral 1 1 tablet oral daily metoprolol succinate ER 100 mg tablet,extended release 24 hr oral one half tablet (50 mg.) extended release 24 hr oral daily amlodipine 5 mg tablet oral 1 1 tablet oral daily furosemide 40 mg tablet oral 1 1 tablet oral two times daily hydrocodone 5 mg-acetaminophen 325 mg tablet oral 1 1 tablet oral two to four times daily Flonase Allergy Relief 50 mcg/actuation nasal spray,suspension nasal spray,suspension nasal as needed celecoxib 200 mg capsule oral 1 1 capsule oral two times daily aspirin 81 mg tablet,delayed release oral 1 1 tablet,delayed release (DR/EC) oral daily cilostazol 100 mg tablet oral 1 1 tablet oral two times daily gabapentin ER 300 mg tablet,extended release 24 hr oral 1 1 tablet extended release 24 hr oral two times daily omeprazole 20 mg tablet,delayed release oral 1 1 tablet,delayed release (DR/EC) oral daily levothyroxine 50 mcg tablet oral 1 1 tablet oraldaily vitamin B complex tablet oral 1 1 tablet oral daily Marisa [name], [name] Objective Constitutional Pulse regular. Respirations normal and unlabored. Afebrile. Vitals Time Taken: 2:12 PM, Height: 60 in, Source: Measured, Weight: 195 lbs, Source: Stated, BMI: 38.1, Temperature: 97.5 F, Pulse: 74 bpm, Respiratory Rate: 18 breaths/min, Blood Pressure: 159/62  mmHg. General Notes: Patient is on 2L of O2. Portable tank arrived with patient. Eyes Nonicteric. Reactive to light. Ears, Nose, Mouth, and Throat Lips, teeth, and gums WNL.Marland Kitchen Moist mucosa without lesions. Neck supple and nontender. No palpable supraclavicular or cervical adenopathy. Normal sized without goiter. Respiratory WNL. No retractions.. Cardiovascular Pedal Pulses WNL. No clubbing, cyanosis or edema. Gastrointestinal (GI) Abdomen without masses or tenderness.. No liver or spleen enlargement or tenderness.. Lymphatic No adneopathy. No adenopathy. No adenopathy. Musculoskeletal Adexa without tenderness or enlargement.. Digits and nails w/o clubbing, cyanosis, infection, petechiae, ischemia, or inflammatory conditions.Marland Kitchen Psychiatric Judgement and insight Intact.. No evidence of depression, anxiety, or agitation.. General Notes: on the right gluteal area approximately 8 cm from the anal verge she has a punched-out ulcerated area with minimal slough at the base. There is no surrounding cellulitis or abscess. No debridement required today. Integumentary (Hair, Skin) No suspicious lesions. No crepitus or fluctuance. No peri-wound warmth or erythema. No masses.. Wound #1 status is Open. Original cause of wound was Bump. The wound is located on the Right Gluteus. Marisa [name], [name] The wound measures 0.6cm length x 1cm width x 0.2cm depth; 0.471cm^2 area and 0.094cm^3 volume. The wound  is limited to skin breakdown. There is no tunneling or undermining noted. There is a medium amount of serous drainage noted. The wound margin is flat and intact. There is small (1-33%) pale granulation within the wound bed. There is a large (67-100%) amount of necrotic tissue within the wound bed including Adherent Slough. The periwound skin appearance exhibited: Moist. The periwound skin appearance did not exhibit: Callus, Crepitus, Excoriation, Fluctuance, Friable, Induration, Localized  Edema, Rash, Scarring, Dry/Scaly, Maceration, Atrophie Blanche, Cyanosis, Ecchymosis, Hemosiderin Staining, Mottled, Pallor, Rubor, Erythema. Assessment Active Problems ICD-10 S31.819A - Unspecified open wound of right buttock, initial encounter L02.32 - Furuncle of buttock The etiology of this ulcerated area in the right gluteal region away from the anal margin is not definitely established. It may have been a frontal which has opened out into an ulcer. there is no evidence of cellulitis, abscess or florid fungal infection. This stage I have recommended: 1. Silver alginate and a bordered foam to be changed as required daily or every other day. 2. Offloading as much as possible 3. See me back in a week's time for follow-up. Plan Wound Cleansing: Wound #1 Left Gluteus: Clean wound with Normal Saline. Anesthetic: Wound #1 Left Gluteus: Topical Lidocaine 4% cream applied to wound bed prior to debridement Primary Wound Dressing: Wound #1 Left Gluteus: Aquacel Ag Secondary Dressing: Wound #1 Left Gluteus: Boardered Foam Dressing Dressing Change Frequency: Marisa [name], [name] ([id number]) Wound #1 Left Gluteus: Change Dressing Monday, Wednesday, Friday - Monday in wound care center Follow-up Appointments: Wound #1 Left Gluteus: Return Appointment in 1 week. Off-Loading: Wound #1 Left Gluteus: Turn and reposition every 2 hours Additional Orders / Instructions: Wound #1 Left Gluteus: Increase protein intake. Home Health: Wound #1 Left Gluteus: Continue Home Health Visits Home Health Nurse may visit PRN to address patient s wound care needs. FACE TO FACE ENCOUNTER: MEDICARE and MEDICAID PATIENTS: I certify that this patient is under my care and that I had a face-to-face encounter that meets the physician face-to-face encounter requirements with this patient on this date. The encounter with the patient was in whole or in part for the following MEDICAL CONDITION: (primary reason for  Home Healthcare) MEDICAL NECESSITY: I certify, that based on my findings, NURSING services are a medically necessary home health service. HOME BOUND STATUS: I certify that my clinical findings support that this patient is homebound (i.e., Due to illness or injury, pt requires aid of supportive devices such as crutches, cane, wheelchairs, walkers, the use of special transportation or the assistance of another person to leave their place of residence. There is a normal inability to leave the home and doing so requires considerable and taxing effort. Other absences are for medical reasons / religious services and are infrequent or of short duration when for other reasons). If current dressing causes regression in wound condition, may D/C ordered dressing product/s and apply Normal Saline Moist Dressing daily until next Wound Healing Center / Other MD appointment. Notify Wound Healing Center of regression in wound condition at [phone]. The etiology of this ulcerated area in the right gluteal region away from the anal margin is not definitely established. It may have been a frontal which has opened out into an ulcer. there is no evidence of cellulitis, abscess or florid fungal infection. This stage I have recommended: 1. Silver alginate and a bordered foam to be changed as required daily or every other day. 2. Offloading as much as possible 3. See me back in a week's time for  follow-up. Electronic Signature(s) Signed: 09/02/2015 3:49:21 PM By: Evlyn Kanner MD, FACS Entered By: Evlyn Kanner on 09/02/2015 15:49:21 Marisa [name], [name] ([id number]) -------------------------------------------------------------------------------- ROS/PFSH Details Patient Name: Marisa [name], Ragena R. Date of Service: 09/02/2015 1:30 PM Medical Record Number: [id number] Patient Account Number: [id number] Date of Birth/Sex: [birth year] (82 y.o. Female) Treating RN: Huel Coventry Primary Care Physician: Jodi Mourning Other  Clinician: Referring Physician: Jodi Mourning Treating Physician/Extender: Rudene Re in Treatment: 0 Information Obtained From Patient Wound History Do you currently have one or more open woundso Yes How many open wounds do you currently haveo 1 Approximately how long have you had your woundso 6 weeks How have you been treating your wound(s) until nowo cream, medication, neosporin Has your wound(s) ever healed and then re-openedo No Have you had any lab work done in the past montho Yes Have you tested positive for an antibiotic resistant organism (MRSA, No VRE)o Have you tested positive for osteomyelitis (bone infection)o No Have you had any tests for circulation on your legso No Eyes Complaints and Symptoms: Positive for: Glasses / Contacts Negative for: Dry Eyes; Vision Changes Medical History: Negative for: Cataracts; Glaucoma Cardiovascular Complaints and Symptoms: Positive for: LE edema Negative for: Chest pain Medical History: Positive for: Congestive Heart Failure; Hypertension Negative for: Angina; Arrhythmia; Coronary Artery Disease; Deep Vein Thrombosis; Hypotension; Myocardial Infarction; Peripheral Arterial Disease; Peripheral Venous Disease; Phlebitis; Vasculitis Endocrine Complaints and Symptoms: Positive for: Thyroid disease Medical History: Marisa [name], Marisa [name] ([id number]) Negative for: Type I Diabetes; Type II Diabetes Integumentary (Skin) Complaints and Symptoms: Positive for: Wounds; Bleeding or bruising tendency; Breakdown Medical History: Negative for: History of Burn; History of pressure wounds Constitutional Symptoms (General Health) Complaints and Symptoms: No Complaints or Symptoms Ear/Nose/Mouth/Throat Complaints and Symptoms: No Complaints or Symptoms Medical History: Negative for: Chronic sinus problems/congestion; Middle ear problems Hematologic/Lymphatic Complaints and Symptoms: No Complaints or Symptoms Medical  History: Negative for: Anemia; Hemophilia; Human Immunodeficiency Virus; Lymphedema; Sickle Cell Disease Respiratory Complaints and Symptoms: No Complaints or Symptoms Medical History: Positive for: Chronic Obstructive Pulmonary Disease (COPD) Negative for: Aspiration; Asthma; Pneumothorax; Sleep Apnea; Tuberculosis Gastrointestinal Complaints and Symptoms: No Complaints or Symptoms Medical History: Negative for: Cirrhosis ; Colitis; Crohnos; Hepatitis A; Hepatitis B; Hepatitis C Genitourinary Complaints and Symptoms: No Complaints or Symptoms Marisa [name], [name] ([id number]) Medical History: Negative for: End Stage Renal Disease Immunological Complaints and Symptoms: No Complaints or Symptoms Medical History: Negative for: Lupus Erythematosus; Raynaudos; Scleroderma Musculoskeletal Complaints and Symptoms: No Complaints or Symptoms Medical History: Positive for: Osteoarthritis Negative for: Gout; Rheumatoid Arthritis; Osteomyelitis Neurologic Complaints and Symptoms: No Complaints or Symptoms Medical History: Negative for: Dementia; Neuropathy; Quadriplegia; Paraplegia; Seizure Disorder Oncologic Complaints and Symptoms: No Complaints or Symptoms Medical History: Negative for: Received Chemotherapy; Received Radiation Psychiatric Complaints and Symptoms: No Complaints or Symptoms Medical History: Negative for: Anorexia/bulimia; Confinement Anxiety Family and Social History Cancer: Yes - Child; Diabetes: No; Heart Disease: Yes - Siblings; Hypertension: Yes - Child; Kidney Disease: No; Lung Disease: No; Seizures: No; Stroke: No; Thyroid Problems: No; Never smoker; Marital Status - Widowed; Alcohol Use: Never; Drug Use: No History; Caffeine Use: Daily; Advanced Directives: No; Patient does not want information on Advanced Directives; Do not resuscitate: No; Living Will: Yes (Not Provided); Medical Power of Attorney: Yes (Not Provided) Marisa [name], [name]  ([id number]) Physician Affirmation I have reviewed and agree with the above information. Electronic Signature(s) Signed: 09/02/2015 2:31:58 PM By: Evlyn Kanner MD, FACS Signed: 09/02/2015 5:25:15 PM By: Elliot Gurney RN, BSN, Kim RN,  BSN Entered By: Evlyn Kanner on 09/02/2015 14:31:56 Archibeque, Jeri R. ([id number]) -------------------------------------------------------------------------------- SuperBill Details Patient Name: Marisa [name], Afrika R. Date of Service: 09/02/2015 Medical Record Number: [id number] Patient Account Number: [id number] Date of Birth/Sex: 1923/12/18 (82 y.o. Female) Treating RN: Huel Coventry Primary Care Physician: Jodi Mourning Other Clinician: Referring Physician: Jodi Mourning Treating Physician/Extender: Rudene Re in Treatment: 0 Diagnosis Coding ICD-10 Codes Code Description [phone] Unspecified open wound of right buttock, initial encounter L02.32 Furuncle of buttock Facility Procedures CPT4 Code: [id number] Description: 99213 - WOUND CARE VISIT-LEV 3 EST PT Modifier: Quantity: 1 Physician Procedures CPT4 Code: 4696295 Description: 99204 - WC PHYS LEVEL 4 - NEW PT ICD-10 Description Diagnosis S31.819A Unspecified open wound of right buttock, initial L02.32 Furuncle of buttock Modifier: encounter Quantity: 1 Electronic Signature(s) Signed: 09/02/2015 3:49:34 PM By: Evlyn Kanner MD, FACS Entered By: Evlyn Kanner on 09/02/2015 15:49:34

## 2015-09-03 ENCOUNTER — Encounter: Payer: Self-pay | Admitting: Pain Medicine

## 2015-09-03 ENCOUNTER — Ambulatory Visit: Payer: Medicare Other | Attending: Pain Medicine | Admitting: Pain Medicine

## 2015-09-03 VITALS — BP 156/55 | HR 71 | Temp 98.2°F | Resp 18 | Ht 61.0 in | Wt 195.0 lb

## 2015-09-03 DIAGNOSIS — M7060 Trochanteric bursitis, unspecified hip: Secondary | ICD-10-CM | POA: Diagnosis not present

## 2015-09-03 DIAGNOSIS — M170 Bilateral primary osteoarthritis of knee: Secondary | ICD-10-CM

## 2015-09-03 DIAGNOSIS — M533 Sacrococcygeal disorders, not elsewhere classified: Secondary | ICD-10-CM

## 2015-09-03 DIAGNOSIS — M51369 Other intervertebral disc degeneration, lumbar region without mention of lumbar back pain or lower extremity pain: Secondary | ICD-10-CM

## 2015-09-03 DIAGNOSIS — M19011 Primary osteoarthritis, right shoulder: Secondary | ICD-10-CM | POA: Diagnosis not present

## 2015-09-03 DIAGNOSIS — W19XXXS Unspecified fall, sequela: Secondary | ICD-10-CM | POA: Insufficient documentation

## 2015-09-03 DIAGNOSIS — G5601 Carpal tunnel syndrome, right upper limb: Secondary | ICD-10-CM | POA: Insufficient documentation

## 2015-09-03 DIAGNOSIS — M7061 Trochanteric bursitis, right hip: Secondary | ICD-10-CM

## 2015-09-03 DIAGNOSIS — S43491A Other sprain of right shoulder joint, initial encounter: Secondary | ICD-10-CM | POA: Diagnosis not present

## 2015-09-03 DIAGNOSIS — R0609 Other forms of dyspnea: Secondary | ICD-10-CM | POA: Insufficient documentation

## 2015-09-03 DIAGNOSIS — M19012 Primary osteoarthritis, left shoulder: Secondary | ICD-10-CM

## 2015-09-03 DIAGNOSIS — M47816 Spondylosis without myelopathy or radiculopathy, lumbar region: Secondary | ICD-10-CM | POA: Diagnosis not present

## 2015-09-03 DIAGNOSIS — M5030 Other cervical disc degeneration, unspecified cervical region: Secondary | ICD-10-CM

## 2015-09-03 DIAGNOSIS — M4806 Spinal stenosis, lumbar region: Secondary | ICD-10-CM | POA: Insufficient documentation

## 2015-09-03 DIAGNOSIS — M48062 Spinal stenosis, lumbar region with neurogenic claudication: Secondary | ICD-10-CM

## 2015-09-03 DIAGNOSIS — Z8781 Personal history of (healed) traumatic fracture: Secondary | ICD-10-CM | POA: Insufficient documentation

## 2015-09-03 DIAGNOSIS — G5603 Carpal tunnel syndrome, bilateral upper limbs: Secondary | ICD-10-CM

## 2015-09-03 DIAGNOSIS — M545 Low back pain: Secondary | ICD-10-CM | POA: Diagnosis present

## 2015-09-03 DIAGNOSIS — M75121 Complete rotator cuff tear or rupture of right shoulder, not specified as traumatic: Secondary | ICD-10-CM | POA: Insufficient documentation

## 2015-09-03 DIAGNOSIS — G5600 Carpal tunnel syndrome, unspecified upper limb: Secondary | ICD-10-CM

## 2015-09-03 DIAGNOSIS — M542 Cervicalgia: Secondary | ICD-10-CM | POA: Diagnosis present

## 2015-09-03 DIAGNOSIS — M7062 Trochanteric bursitis, left hip: Secondary | ICD-10-CM

## 2015-09-03 DIAGNOSIS — M5136 Other intervertebral disc degeneration, lumbar region: Secondary | ICD-10-CM

## 2015-09-03 MED ORDER — TRAMADOL HCL 50 MG PO TABS: ORAL_TABLET | ORAL | Status: DC

## 2015-09-03 MED ORDER — HYDROCODONE-ACETAMINOPHEN 5-325 MG PO TABS: ORAL_TABLET | ORAL | Status: DC

## 2015-09-03 NOTE — Progress Notes (Signed)
Marisa [name], Marisa [name] ([id number]) Visit Report for 09/02/2015 Abuse/Suicide Risk Screen Details Patient Name: Marisa [name], [name] Date of Service: 09/02/2015 1:30 PM Medical Record Number: [id number] Patient Account Number: [id number] Date of Birth/Sex: 22-Aug-1923 (82 y.o. Female) Treating RN: Huel Coventry Primary Care Physician: Jodi Mourning Other Clinician: Referring Physician: Jodi Mourning Treating Physician/Extender: Rudene Re in Treatment: 0 Abuse/Suicide Risk Screen Items Answer ABUSE/SUICIDE RISK SCREEN: Has anyone close to you tried to hurt or harm you recentlyo No Do you feel uncomfortable with anyone in your familyo No Has anyone forced you do things that you didnot want to doo No Do you have any thoughts of harming yourselfo No Patient displays signs or symptoms of abuse and/or neglect. No Electronic Signature(s) Signed: 09/02/2015 5:25:15 PM By: Elliot Gurney, RN, BSN, Kim RN, BSN Entered By: Elliot Gurney, RN, BSN, Kim on 09/02/2015 14:20:54 Marisa [name], [name] -------------------------------------------------------------------------------- Activities of Daily Living Details Patient Name: Marisa [name], [name] Date of Service: 09/02/2015 1:30 PM Medical Record Number: [id number] Patient Account Number: [id number] Date of Birth/Sex: February 28, 1924 (82 y.o. Female) Treating RN: Huel Coventry Primary Care Physician: Jodi Mourning Other Clinician: Referring Physician: Jodi Mourning Treating Physician/Extender: Rudene Re in Treatment: 0 Activities of Daily Living Items Answer Activities of Daily Living (Please select one for each item) Drive Automobile Not Able Take Medications Need Assistance Use Telephone Need Assistance Care for Appearance Need Assistance Use Toilet Need Assistance Bath / Shower Need Assistance Dress Self Need Assistance Feed Self Need Assistance Walk Need Assistance Get In / Out Bed Need Assistance Housework Need Assistance Prepare Meals  Need Assistance Handle Money Need Assistance Shop for Self Need Assistance Electronic Signature(s) Signed: 09/02/2015 5:25:15 PM By: Elliot Gurney, RN, BSN, Kim RN, BSN Entered By: Elliot Gurney, RN, BSN, Kim on 09/02/2015 14:22:08 Marisa [name], [name] ([id number]) -------------------------------------------------------------------------------- Education Assessment Details Patient Name: Marisa [name], [name] Date of Service: 09/02/2015 1:30 PM Medical Record Number: [id number] Patient Account Number: [id number] Date of Birth/Sex: 12-06-1923 (82 y.o. Female) Treating RN: Huel Coventry Primary Care Physician: Jodi Mourning Other Clinician: Referring Physician: Jodi Mourning Treating Physician/Extender: Rudene Re in Treatment: 0 Primary Learner Assessed: Patient Learning Preferences/Education Level/Primary Language Learning Preference: Explanation, Demonstration Highest Education Level: Grade School Preferred Language: English Cognitive Barrier Assessment/Beliefs Language Barrier: No Translator Needed: No Memory Deficit: No Emotional Barrier: No Cultural/Religious Beliefs Affecting Medical No Care: Physical Barrier Assessment Impaired Vision: Yes Glasses Impaired Hearing: No Knowledge/Comprehension Assessment Knowledge Level: Medium Comprehension Level: Medium Ability to understand written Medium instructions: Ability to understand verbal Medium instructions: Motivation Assessment Anxiety Level: Calm Cooperation: Cooperative Education Importance: Acknowledges Need Interest in Health Problems: Asks Questions Perception: Coherent Willingness to Engage in Self- High Management Activities: Readiness to Engage in Self- High Management Activities: Electronic Signature(s) Signed: 09/02/2015 5:25:15 PM By: Elliot Gurney, RN, BSN, Kim RN, BSN Marisa [name], [name] ([id number]) Entered By: Elliot Gurney, RN, BSN, Kim on 09/02/2015 14:22:41 Marisa [name], [name]  ([id number]) -------------------------------------------------------------------------------- Fall Risk Assessment Details Patient Name: Marisa [name], [name] Date of Service: 09/02/2015 1:30 PM Medical Record Number: [id number] Patient Account Number: [id number] Date of Birth/Sex: 13-Aug-1923 (82 y.o. Female) Treating RN: Huel Coventry Primary Care Physician: Jodi Mourning Other Clinician: Referring Physician: Jodi Mourning Treating Physician/Extender: Rudene Re in Treatment: 0 Fall Risk Assessment Items Have you had 2 or more falls in the last 12 monthso 0 Yes Have you had any fall that resulted in injury in the last 12 monthso 0 Yes FALL RISK ASSESSMENT: History of falling -  immediate or within 3 months 25 Yes Secondary diagnosis 0 No Ambulatory aid None/bed rest/wheelchair/nurse 0 No Crutches/cane/walker 15 Yes Furniture 0 No IV Access/Saline Lock 0 No Gait/Training Normal/bed rest/immobile 0 No Weak 10 Yes Impaired 0 No Mental Status Oriented to own ability 0 No Electronic Signature(s) Signed: 09/02/2015 5:25:15 PM By: Elliot GurneyWoody, RN, BSN, Kim RN, BSN Entered By: Elliot GurneyWoody, RN, BSN, Kim on 09/02/2015 14:23:04 Marisa [name], [name] ([id number]) -------------------------------------------------------------------------------- Nutrition Risk Assessment Details Patient Name: Marisa [name], [name] Date of Service: 09/02/2015 1:30 PM Medical Record Number: [id number] Patient Account Number: [id number] Date of Birth/Sex: 1924/01/01 (82 y.o. Female) Treating RN: Huel CoventryWoody, Kim Primary Care Physician: Jodi MourningWalker III, John Other Clinician: Referring Physician: Jodi MourningWalker III, John Treating Physician/Extender: Rudene ReBritto, Errol Weeks in Treatment: 0 Height (in): 60 Weight (lbs): 195 Body Mass Index (BMI): 38.1 Nutrition Risk Assessment Items NUTRITION RISK SCREEN: I have an illness or condition that made me change the kind and/or 0 No amount of food I eat I eat fewer than two meals per day 0 No I eat few  fruits and vegetables, or milk products 0 No I have three or more drinks of beer, liquor or wine almost every day 0 No I have tooth or mouth problems that make it hard for me to eat 0 No I don't always have enough money to buy the food I need 0 No I eat alone most of the time 0 No I take three or more different prescribed or over-the-counter drugs a 1 Yes day Without wanting to, I have lost or gained 10 pounds in the last six 0 No months I am not always physically able to shop, cook and/or feed myself 0 No Nutrition Protocols Good Risk Protocol 0 No interventions needed Moderate Risk Protocol Electronic Signature(s) Signed: 09/02/2015 5:25:15 PM By: Elliot GurneyWoody, RN, BSN, Kim RN, BSN Entered By: Elliot GurneyWoody, RN, BSN, Kim on 09/02/2015 14:23:21

## 2015-09-03 NOTE — Patient Instructions (Addendum)
PLAN   Continue present medication tramadol and hydrocodone acetaminophen continue Voltaren gel applications. May apply to the right lower extremity and to shoulder as we discussed today  F/U PCP Dr. Yates DecampJohn Walker III  for evaliation of  BP lesion of the right gluteal region and general medical  condition.   F/U surgical evaluation. May consider further evaluation of lumbar and lower extremity pain pending follow-up evaluations . Patient has undergone prior surgical evaluation and without plans for surgical intervention due to general medical condition. Follow-up with Dr.[name] and with [name] regarding shoulder as discussed   F/U Dr. Joice LoftsPoggi for follow-up evaluation of right shoulder    F/U neurological evaluation. May consider pending follow-up evaluations We will avoid [facility] EMG studies and such studies at this time  Wound care clinic follow-up evaluation. Please follow-up with wound care clinic for evaluation of lesion of the right gluteal region  May consider radiofrequency rhizolysis or intraspinal procedures pending response to present treatment and F/U evaluation  We will avoid such treatment at this time  Patient to call [facility] Management Center should patient have concerns prior to scheduled return appointment

## 2015-09-03 NOTE — Progress Notes (Signed)
Ulcer on right buttock about size of dime, being treated at the wound care center.

## 2015-09-03 NOTE — Progress Notes (Signed)
Subjective:    Patient ID: Marisa [name], female    DOB: 04-17-1923, 82 y.o.   MRN: [id number]  HPI  The patient is a 82 year old female who returns to pain management for further evaluation and treatment of pain involving neck mid lower back lower extremity regions upper extremity pain is well. The patient is status post recent fall when patient was in kitchen. The patient stated that she was trying to place a headaches in the refrigerator when she fell. The patient did not go to the emergency department for evaluation. The patient's son did call EMS and patient was questioned and evaluated and decision was made to avoid traveling to the emergency department. The patient states that the pain is fairly well-controlled at this time. The patient denies any other trauma change in events of daily living the call significant change in symptomatology. We have discussed patient's shoulder which was injured when patient had previous fall. The patient underwent injection of the shoulder by Gerda Dissobert Twomey. The patient will follow-up with Dr. Joice LoftsPoggi in this regard.. The patient also has lesion of the right gluteal region. We discussed patient's condition and have advised patient to follow-up with Dr. Yates DecampJohn Walker and wound care clinic for further evaluation and treatment of lesion of the right gluteal region. We emphasized the need to try to avoid pressure as well as the need to try to elevate the area in an effort to promote healing. The patient denies any other trauma change in events of daily living the call significant change in symptomatology. We will continue patient's tramadol and hydrocodone as prescribed. The patient is tolerating medications well without undesirable side effects. The patient will also apply Voltaren gel to the region of the hip and to the shoulder as discussed  Review of Systems     Objective:   Physical Exam  There was tenderness to palpation of the paraspinal muscular treat and  cervical region cervical facet region palpation which be produced pain of mild degree with mild tenderness of the splenius capitis and occipitalis region. Palpation of the thoracic region thoracic facet region was attends to palpation of mild to moderate degree. No crepitus of the thoracic region was noted. There was tenderness of the acromioclavicular and glenohumeral joint region of the right shoulder of moderate degree. There was limited range of motion of the l right shoulder compared to the left shoulder. There was increased pain with Tinel and Phalen's maneuver performed on the right with compared to the left wrist. There was slightly decreased grip strength on the right compared to the left. Palpation of the lumbar region lumbar facet region was attends to palpation of moderate degree with moderate tenderness of the PSIS and PII S region. There was moderately severe tenderness of the greater trochanteric region iliotibial band region on the right compared to the left. The knees were without excessive tends to palpation. There was no increased warmth and erythema in the region of the knees. EHL strength appeared to be slightly decreased. There was superficial varicosities of the lower extremities noted with negative clonus negative Homans. 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 Edema in the infraspinatus muscle belly is likely due to subacute injury given history of trauma. Denervation atrophy is  thought less likely. All imaged musculature demonstrates some fatty replacement.  Complete tear of the long head of biceps from the superior labrum.  Bulky acromioclavicular osteoarthritis. Glenohumeral degenerative change is also  seen.ive disc disease lumbar spine Multilevel degenerative changes lumbar spine lumbar stenosis multiple levels L3-4 L4-5 L1-2 and L2-3. Lumbar facet degenerative changes, foraminal narrowing, ligamentum flavum hypertrophy  Lumbar stenosis with with neurogenic claudication  Lumbar facet syndrome  Greater trochanteric bursitis  Degenerative joint disease of knees  Carpal tunnel syndrome (status post fracture of right wrist)      PLAN   Continue present medication tramadol and hydrocodone acetaminophen continue Voltaren gel applications. May apply Voltaren gel to the right lower extremity and to shoulder as we discussed today  F/U PCP Dr. [name] III  for evaliation of  BP lesion of the right gluteal region and general medical  condition.   F/U surgical evaluation. May consider further evaluation of lumbar and lower extremity pain pending follow-up evaluations . Patient has undergone prior surgical evaluation and without plans for surgical intervention due to general medical condition. Follow-up with Dr.[name] and with Mahalia Longest regarding shoulder as discussed   F/U Dr. Joice Lofts for follow-up evaluation of right shoulder    F/U neurological evaluation. May consider pending follow-up evaluations We will avoid [facility] EMG studies and such studies at this time  Wound care clinic follow-up evaluation. Please follow-up with wound care clinic for evaluation of lesion of the right gluteal region  May consider radiofrequency rhizolysis or intraspinal procedures pending response to present treatment and F/U evaluation  We will avoid such treatment at this time  Patient to call Pain Management Center should patient have concerns prior to scheduled return appointment

## 2015-09-03 NOTE — Progress Notes (Signed)
Marisa [name] ([id number]) Visit Report for 09/02/2015 Allergy List Details Patient Name: [name], [name] Date of Service: 09/02/2015 1:30 PM Medical Record Number: [id number] Patient Account Number: [id number] Date of Birth/Sex: Feb 02, 1924 (82 y.o. Female) Treating RN: Huel Coventry Primary Care Physician: Jodi Mourning Other Clinician: Referring Physician: Jodi Mourning Treating Physician/Extender: Rudene Re in Treatment: 0 Allergies Active Allergies sulfur PCN Allergy Notes Electronic Signature(s) Signed: 09/02/2015 5:25:15 PM By: Elliot Gurney, RN, BSN, Kim RN, BSN Entered By: Elliot Gurney, RN, BSN, Kim on 09/02/2015 14:15:10 [name], [name] -------------------------------------------------------------------------------- Arrival Information Details Patient Name: Marisa [name], [name] Date of Service: 09/02/2015 1:30 PM Medical Record Number: [id number] Patient Account Number: [id number] Date of Birth/Sex: 08-01-1923 (82 y.o. Female) Treating RN: Huel Coventry Primary Care Physician: Jodi Mourning Other Clinician: Referring Physician: Jodi Mourning Treating Physician/Extender: Rudene Re in Treatment: 0 Visit Information Patient Arrived: [name] Time: 14:10 Accompanied By: daughter, Junious Dresser Transfer Assistance: Manual Patient Identification Verified: Yes Secondary Verification Process Yes Completed: Patient Requires Transmission- No Based Precautions: Patient Has Alerts: Yes Patient Alerts: Patient on Blood Thinner Aspirin Electronic Signature(s) Signed: 09/02/2015 5:25:15 PM By: Elliot Gurney, RN, BSN, Kim RN, BSN Entered By: Elliot Gurney, RN, BSN, Kim on 09/02/2015 14:12:36 [name], [name] ([id number]) -------------------------------------------------------------------------------- Clinic Level of Care Assessment Details Patient Name: [name], [name] Date of Service: 09/02/2015 1:30 PM Medical Record Number: [id number] Patient Account Number:  [id number] Date of Birth/Sex: [birth year] (82 y.o. Female) Treating RN: Huel Coventry Primary Care Physician: Jodi Mourning Other Clinician: Referring Physician: Jodi Mourning Treating Physician/Extender: Rudene Re in Treatment: 0 Clinic Level of Care Assessment Items TOOL 2 Quantity Score  - Use when only an EandM is performed on the INITIAL visit 0 ASSESSMENTS - Nursing Assessment / Reassessment  - General Physical Exam (combine w/ comprehensive assessment (listed just 0 below) when performed on new pt. evals) X - Comprehensive Assessment (HX, ROS, Risk Assessments, Wounds Hx, etc.) 1 25 ASSESSMENTS - Wound and Skin Assessment / Reassessment X - Simple Wound Assessment / Reassessment - one wound 1 5  - Complex Wound Assessment / Reassessment - multiple wounds 0  - Dermatologic / Skin Assessment (not related to wound area) 0 ASSESSMENTS - Ostomy and/or Continence Assessment and Care  - Incontinence Assessment and Management 0  - Ostomy Care Assessment and Management (repouching, etc.) 0 PROCESS - Coordination of Care X - Simple Patient / Family Education for ongoing care 1 15  - Complex (extensive) Patient / Family Education for ongoing care 0 X - Staff obtains Consents, Records, Test Results / Process Orders 1 10  - Staff telephones HHA, Nursing Homes / Clarify orders / etc 0  - Routine Transfer to another Facility (non-emergent condition) 0  - Routine Hospital Admission (non-emergent condition) 0  - New Admissions / Manufacturing engineer / Ordering NPWT, Apligraf, etc. 0  - Emergency Hospital Admission (emergent condition) 0 X - Simple Discharge Coordination 1 10 [name], [name]  - Complex (extensive) Discharge Coordination 0 PROCESS - Special Needs  - Pediatric / Minor Patient Management 0  - Isolation Patient Management 0  - Hearing / Language / Visual special needs 0  - Assessment of Community assistance  (transportation, D/C planning, etc.) 0  - Additional assistance / Altered mentation 0  - Support Surface(s) Assessment (bed, cushion, seat, etc.) 0 INTERVENTIONS - Wound Cleansing / Measurement X - Wound Imaging (photographs - any number of wounds) 1 5  - Wound Tracing (instead of  photographs) 0 X - Simple Wound Measurement - one wound 1 5 []  - Complex Wound Measurement - multiple wounds 0 X - Simple Wound Cleansing - one wound 1 5 []  - Complex Wound Cleansing - multiple wounds 0 INTERVENTIONS - Wound Dressings X - Small Wound Dressing one or multiple wounds 1 10 []  - Medium Wound Dressing one or multiple wounds 0 []  - Large Wound Dressing one or multiple wounds 0 []  - Application of Medications - injection 0 INTERVENTIONS - Miscellaneous []  - External ear exam 0 []  - Specimen Collection (cultures, biopsies, blood, body fluids, etc.) 0 []  - Specimen(s) / Culture(s) sent or taken to Lab for analysis 0 []  - Patient Transfer (multiple staff / Nurse, adult / Similar devices) 0 []  - Simple Staple / Suture removal (25 or less) 0 []  - Complex Staple / Suture removal (26 or more) 0 [name], [name] []  - Hypo / Hyperglycemic Management (close monitor of Blood Glucose) 0 []  - Ankle / Brachial Index (ABI) - do not check if billed separately 0 Has the patient been seen at the hospital within the last three years: Yes Total Score: 90 Level Of Care: New/Established - Level 3 Electronic Signature(s) Signed: 09/02/2015 5:25:15 PM By: Elliot Gurney, RN, BSN, Kim RN, BSN Entered By: Elliot Gurney, RN, BSN, Kim on 09/02/2015 14:44:22 [name], [name] -------------------------------------------------------------------------------- Encounter Discharge Information Details Patient Name: Marisa [name], [name] Date of Service: 09/02/2015 1:30 PM Medical Record Number: [id number] Patient Account Number: [id number] Date of Birth/Sex: 1923-11-05 (82 y.o. Female) Treating RN: Huel Coventry Primary Care  Physician: Jodi Mourning Other Clinician: Referring Physician: Jodi Mourning Treating Physician/Extender: Rudene Re in Treatment: 0 Encounter Discharge Information Items Discharge Pain Level: 0 Discharge Condition: Stable Ambulatory Status: Walker Discharge Destination: Home Transportation: Private Auto Accompanied By: daughters Schedule Follow-up Appointment: Yes Medication Reconciliation completed Yes and provided to Patient/Care Amai Cappiello: Provided on Clinical Summary of Care: 09/02/2015 Form Type Recipient Paper Patient GW Electronic Signature(s) Signed: 09/02/2015 2:53:13 PM By: Gwenlyn Perking Entered By: Gwenlyn Perking on 09/02/2015 14:53:13 [place], Jamye R. ([id number]) -------------------------------------------------------------------------------- Lower Extremity Assessment Details Patient Name: [name], [name] Date of Service: 09/02/2015 1:30 PM Medical Record Number: [id number] Patient Account Number: [id number] Date of Birth/Sex: 12/28/1923 (82 y.o. Female) Treating RN: Huel Coventry Primary Care Physician: Jodi Mourning Other Clinician: Referring Physician: Jodi Mourning Treating Physician/Extender: Rudene Re in Treatment: 0 Electronic Signature(s) Signed: 09/02/2015 5:25:15 PM By: Elliot Gurney RN, BSN, Kim RN, BSN Entered By: Elliot Gurney, RN, BSN, Kim on 09/02/2015 14:13:32 [name], [name] ([id number]) -------------------------------------------------------------------------------- Multi Wound Chart Details Patient Name: [name], [name] Date of Service: 09/02/2015 1:30 PM Medical Record Number: [id number] Patient Account Number: [id number] Date of Birth/Sex: [birth year] (82 y.o. Female) Treating RN: Huel Coventry Primary Care Physician: Jodi Mourning Other Clinician: Referring Physician: Jodi Mourning Treating Physician/Extender: Rudene Re in Treatment: 0 Vital Signs Height(in): 60 Pulse(bpm): 74 Weight(lbs): 195 Blood  Pressure 159/62 (mmHg): Body Mass Index(BMI): 38 Temperature(F): 97.5 Respiratory Rate 18 (breaths/min): Photos: [N/A:N/A] Wound Location: Left Gluteus N/A N/A Wounding Event: Bump N/A N/A Primary Etiology: Pressure Ulcer N/A N/A Comorbid History: Chronic Obstructive N/A N/A Pulmonary Disease (COPD), Congestive Heart Failure, Hypertension, Osteoarthritis Date Acquired: 08/26/2015 N/A N/A Weeks of Treatment: 0 N/A N/A Wound Status: Open N/A N/A Measurements L x W x D 0.6x1x0.2 N/A N/A (cm) Area (cm) : 0.471 N/A N/A Volume (cm) : 0.094 N/A N/A % Reduction in Area: 0.00% N/A N/A % Reduction in  Volume: 0.00% N/A N/A Classification: Category/Stage II N/A N/A Exudate Amount: Medium N/A N/A Exudate Type: Serous N/A N/A Exudate Color: amber N/A N/A Wound Margin: Flat and Intact N/A N/A Mccleery, Janyth R. ([id number]) Granulation Amount: Small (1-33%) N/A N/A Granulation Quality: Pale N/A N/A Necrotic Amount: Large (67-100%) N/A N/A Exposed Structures: Fascia: No N/A N/A Fat: No Tendon: No Muscle: No Joint: No Bone: No Limited to Skin Breakdown Periwound Skin Texture: Edema: No N/A N/A Excoriation: No Induration: No Callus: No Crepitus: No Fluctuance: No Friable: No Rash: No Scarring: No Periwound Skin Moist: Yes N/A N/A Moisture: Maceration: No Dry/Scaly: No Periwound Skin Color: Atrophie Blanche: No N/A N/A Cyanosis: No Ecchymosis: No Erythema: No Hemosiderin Staining: No Mottled: No Pallor: No Rubor: No Tenderness on No N/A N/A Palpation: Wound Preparation: Ulcer Cleansing: N/A N/A Rinsed/Irrigated with Saline Topical Anesthetic Applied: Other: lidocaine 4% Treatment Notes Electronic Signature(s) Signed: 09/02/2015 5:25:15 PM By: Elliot GurneyWoody, RN, BSN, Kim RN, BSN Entered By: Elliot GurneyWoody, RN, BSN, Kim on 09/02/2015 14:37:31 Trick, Pearly Marland Kitchen. ([id number]) -------------------------------------------------------------------------------- Multi-Disciplinary Care  Plan Details Patient Name: Marisa SchleinWOOTEN, Jeriyah R. Date of Service: 09/02/2015 1:30 PM Medical Record Number: [id number] Patient Account Number: [id number] Date of Birth/Sex: 03-Feb-1924 (82 y.o. Female) Treating RN: Huel CoventryWoody, Kim Primary Care Physician: Jodi MourningWalker III, John Other Clinician: Referring Physician: Jodi MourningWalker III, John Treating Physician/Extender: Rudene ReBritto, Errol Weeks in Treatment: 0 Active Inactive Abuse / Safety / Falls / Self Care Management Nursing Diagnoses: Potential for falls Goals: Patient will remain injury free Date Initiated: 09/02/2015 Goal Status: Active Interventions: Assess fall risk on admission and as needed Notes: Nutrition Nursing Diagnoses: Imbalanced nutrition Goals: Patient/caregiver agrees to and verbalizes understanding of need to obtain nutritional consultation Date Initiated: 09/02/2015 Goal Status: Active Interventions: Provide education on nutrition Notes: Orientation to the Wound Care Program Nursing Diagnoses: Knowledge deficit related to the wound healing center program Goals: Patient/caregiver will verbalize understanding of the Wound Healing Center Program Date Initiated: 09/02/2015 Gordy SaversWOOTEN, [name] ([id number]) Goal Status: Active Interventions: Provide education on orientation to the wound center Notes: Wound/Skin Impairment Nursing Diagnoses: Impaired tissue integrity Goals: Ulcer/skin breakdown will heal within 14 weeks Date Initiated: 09/02/2015 Goal Status: Active Interventions: Assess patient/caregiver ability to obtain necessary supplies Notes: Electronic Signature(s) Signed: 09/02/2015 5:25:15 PM By: Elliot GurneyWoody, RN, BSN, Kim RN, BSN Entered By: Elliot GurneyWoody, RN, BSN, Kim on 09/02/2015 14:37:18 Hoelting, Carmin R. ([id number]) -------------------------------------------------------------------------------- Pain Assessment Details Patient Name: Marisa SchleinWOOTEN, [name] Date of Service: 09/02/2015 1:30 PM Medical Record Number: [id number] Patient Account  Number: [id number] Date of Birth/Sex: 03-Feb-1924 (82 y.o. Female) Treating RN: Huel CoventryWoody, Kim Primary Care Physician: Jodi MourningWalker III, John Other Clinician: Referring Physician: Jodi MourningWalker III, John Treating Physician/Extender: Rudene ReBritto, Errol Weeks in Treatment: 0 Active Problems Location of Pain Severity and Description of Pain Patient Has Paino No Site Locations With Dressing Change: No Pain Management and Medication Current Pain Management: Electronic Signature(s) Signed: 09/02/2015 5:25:15 PM By: Elliot GurneyWoody, RN, BSN, Kim RN, BSN Entered By: Elliot GurneyWoody, RN, BSN, Kim on 09/02/2015 14:12:49 Faye, Ellouise Marland Kitchen. ([id number]) -------------------------------------------------------------------------------- Patient/Caregiver Education Details Patient Name: Marisa SchleinWOOTEN, Rogelio R. Date of Service: 09/02/2015 1:30 PM Medical Record Number: [id number] Patient Account Number: [id number] Date of Birth/Gender: 03-Feb-1924 (82 y.o. Female) Treating RN: Huel CoventryWoody, Kim Primary Care Physician: Jodi MourningWalker III, John Other Clinician: Referring Physician: Jodi MourningWalker III, John Treating Physician/Extender: Rudene ReBritto, Errol Weeks in Treatment: 0 Education Assessment Education Provided To: Patient Education Topics Provided Wound/Skin Impairment: Handouts: Caring for Your Ulcer Methods: Demonstration, Explain/Verbal Responses: State content correctly Electronic Signature(s) Signed: 09/02/2015 5:25:15 PM  By: Elliot Gurney, RN, BSN, Kim RN, BSN Entered By: Elliot Gurney, RN, BSN, Kim on 09/02/2015 14:45:53 Marzano, Destinie R. ([id number]) -------------------------------------------------------------------------------- Wound Assessment Details Patient Name: Steinkamp, Lovinia R. Date of Service: 09/02/2015 1:30 PM Medical Record Number: [id number] Patient Account Number: [id number] Date of Birth/Sex: 05-16-1923 (82 y.o. Female) Treating RN: Huel Coventry Primary Care Physician: Jodi Mourning Other Clinician: Referring Physician: Jodi Mourning Treating Physician/Extender:  Rudene Re in Treatment: 0 Wound Status Wound Number: 1 Primary Pressure Ulcer Etiology: Wound Location: Left Gluteus Wound Open Wounding Event: Bump Status: Date Acquired: 08/26/2015 Comorbid Chronic Obstructive Pulmonary Disease Weeks Of Treatment: 0 History: (COPD), Congestive Heart Failure, Clustered Wound: No Hypertension, Osteoarthritis Photos Wound Measurements Length: (cm) 0.6 Width: (cm) 1 Depth: (cm) 0.2 Area: (cm) 0.471 Volume: (cm) 0.094 % Reduction in Area: 0% % Reduction in Volume: 0% Tunneling: No Undermining: No Wound Description Classification: Category/Stage II Wound Margin: Flat and Intact Exudate Amount: Medium Exudate Type: Serous Exudate Color: amber Wound Bed Granulation Amount: Small (1-33%) Exposed Structure Granulation Quality: Pale Fascia Exposed: No Necrotic Amount: Large (67-100%) Fat Layer Exposed: No Necrotic Quality: Adherent Slough Tendon Exposed: No Muscle Exposed: No Boateng, Yurani R. ([id number]) Joint Exposed: No Bone Exposed: No Limited to Skin Breakdown Periwound Skin Texture Texture Color No Abnormalities Noted: No No Abnormalities Noted: No Callus: No Atrophie Blanche: No Crepitus: No Cyanosis: No Excoriation: No Ecchymosis: No Fluctuance: No Erythema: No Friable: No Hemosiderin Staining: No Induration: No Mottled: No Localized Edema: No Pallor: No Rash: No Rubor: No Scarring: No Moisture No Abnormalities Noted: No Dry / Scaly: No Maceration: No Moist: Yes Wound Preparation Ulcer Cleansing: Rinsed/Irrigated with Saline Topical Anesthetic Applied: Other: lidocaine 4%, Electronic Signature(s) Signed: 09/02/2015 5:25:15 PM By: Elliot Gurney, RN, BSN, Kim RN, BSN Entered By: Elliot Gurney, RN, BSN, Kim on 09/02/2015 14:29:15 Maish, Jadence R. ([id number]) -------------------------------------------------------------------------------- Vitals Details Patient Name: Marisa [name], Nikia R. Date of Service: 09/02/2015  1:30 PM Medical Record Number: [id number] Patient Account Number: [id number] Date of Birth/Sex: [birth year] (82 y.o. Female) Treating RN: Huel Coventry Primary Care Physician: Jodi Mourning Other Clinician: Referring Physician: Jodi Mourning Treating Physician/Extender: Rudene Re in Treatment: 0 Vital Signs Time Taken: 14:12 Temperature (F): 97.5 Height (in): 60 Pulse (bpm): 74 Source: Measured Respiratory Rate (breaths/min): 18 Weight (lbs): 195 Blood Pressure (mmHg): 159/62 Source: Stated Reference Range: 80 - 120 mg / dl Body Mass Index (BMI): 38.1 Notes Patient is on 2L of O2. Portable tank arrived with patient. Electronic Signature(s) Signed: 09/02/2015 5:25:15 PM By: Elliot Gurney, RN, BSN, Kim RN, BSN Entered By: Elliot Gurney, RN, BSN, Kim on 09/02/2015 14:32:37

## 2015-09-09 ENCOUNTER — Encounter: Payer: Medicare Other | Admitting: Surgery

## 2015-09-09 DIAGNOSIS — S31819A Unspecified open wound of right buttock, initial encounter: Secondary | ICD-10-CM | POA: Diagnosis not present

## 2015-09-09 NOTE — Progress Notes (Signed)
Marisa, [name] ([id number]) Visit Report for 09/09/2015 Arrival Information Details Patient Name: Marisa [name], Marisa [name]. Date of Service: 09/09/2015 10:00 AM Medical Record Number: [id number] Patient Account Number: [id number] Date of Birth/Sex: 12-11-1923 (82 y.o. Female) Treating RN: Afful, RN, BSN, [place] Sink Primary Care Physician: Jodi Mourning Other Clinician: Referring Physician: Jodi Mourning Treating Physician/Extender: Rudene Re in Treatment: 1 Visit Information History Since Last Visit Added or deleted any medications: No Patient Arrived: Walker Any new allergies or adverse reactions: No Arrival Time: 09:55 Had a fall or experienced change in No Accompanied By: self activities of daily living that may affect Transfer Assistance: None risk of falls: Patient Identification Verified: Yes Signs or symptoms of abuse/neglect since last No Secondary Verification Process Yes visito Completed: Hospitalized since last visit: No Patient Requires Transmission- No Has Dressing in Place as Prescribed: Yes Based Precautions: Pain Present Now: No Patient Has Alerts: Yes Patient Alerts: Patient on Blood Thinner Aspirin Electronic Signature(s) Signed: 09/09/2015 2:18:57 PM By: Elpidio Eric BSN, RN Entered By: Elpidio Eric on 09/09/2015 09:57:56 [name], [name] -------------------------------------------------------------------------------- Encounter Discharge Information Details Patient Name: Marisa [name], [name] Date of Service: 09/09/2015 10:00 AM Medical Record Number: [id number] Patient Account Number: [id number] Date of Birth/Sex: Oct 26, 1923 (82 y.o. Female) Treating RN: Clover Mealy, RN, BSN, [place] Sink Primary Care Physician: Jodi Mourning Other Clinician: Referring Physician: Jodi Mourning Treating Physician/Extender: Rudene Re in Treatment: 1 Encounter Discharge Information Items Discharge Pain Level: 0 Discharge Condition: Stable Ambulatory Status:  Walker Discharge Destination: Home Transportation: Private Auto Accompanied By: son in lobby Schedule Follow-up Appointment: No Medication Reconciliation completed No and provided to Patient/Care Marisa [name]: Provided on Clinical Summary of Care: 09/09/2015 Form Type Recipient Paper Patient GW Electronic Signature(s) Signed: 09/09/2015 2:18:57 PM By: Elpidio Eric BSN, RN Previous Signature: 09/09/2015 10:18:08 AM Version By: Gwenlyn Perking Entered By: Elpidio Eric on 09/09/2015 10:20:17 [name], [name] ([id number]) -------------------------------------------------------------------------------- Multi Wound Chart Details Patient Name: Schimming, [name] Date of Service: 09/09/2015 10:00 AM Medical Record Number: [id number] Patient Account Number: [id number] Date of Birth/Sex: 05-16-1923 (82 y.o. Female) Treating RN: Clover Mealy, RN, BSN, [place] Sink Primary Care Physician: Jodi Mourning Other Clinician: Referring Physician: Jodi Mourning Treating Physician/Extender: Rudene Re in Treatment: 1 Vital Signs Height(in): 60 Pulse(bpm): 75 Weight(lbs): 195 Blood Pressure 179/99 (mmHg): Body Mass Index(BMI): 38 Temperature(F): 97.8 Respiratory Rate 18 (breaths/min): Photos: [1:No Photos] [N/A:N/A] Wound Location: [1:Right Gluteus] [N/A:N/A] Wounding Event: [1:Bump] [N/A:N/A] Primary Etiology: [1:Abscess] [N/A:N/A] Comorbid History: [1:Chronic Obstructive Pulmonary Disease (COPD), Congestive Heart Failure, Hypertension, Osteoarthritis] [N/A:N/A] Date Acquired: [1:08/26/2015] [N/A:N/A] Weeks of Treatment: [1:1] [N/A:N/A] Wound Status: [1:Open] [N/A:N/A] Measurements L x W x D 0.6x0.8x0.1 [N/A:N/A] (cm) Area (cm) : [1:0.377] [N/A:N/A] Volume (cm) : [1:0.038] [N/A:N/A] % Reduction in Area: [1:20.00%] [N/A:N/A] % Reduction in Volume: 59.60% [N/A:N/A] Classification: [1:Partial Thickness] [N/A:N/A] Exudate Amount: [1:Medium] [N/A:N/A] Exudate Type: [1:Serosanguineous]  [N/A:N/A] Exudate Color: [1:red, brown] [N/A:N/A] Wound Margin: [1:Distinct, outline attached] [N/A:N/A] Granulation Amount: [1:None Present (0%)] [N/A:N/A] Necrotic Amount: [1:Large (67-100%)] [N/A:N/A] Exposed Structures: [1:Fascia: No Fat: No Tendon: No Muscle: No] [N/A:N/A] Joint: No Bone: No Limited to Skin Breakdown Epithelialization: None N/A N/A Periwound Skin Texture: Edema: No N/A N/A Excoriation: No Induration: No Callus: No Crepitus: No Fluctuance: No Friable: No Rash: No Scarring: No Periwound Skin Moist: Yes N/A N/A Moisture: Maceration: No Dry/Scaly: No Periwound Skin Color: Atrophie Blanche: No N/A N/A Cyanosis: No Ecchymosis: No Erythema: No Hemosiderin Staining: No Mottled: No Pallor: No Rubor: No Temperature: No Abnormality N/A  N/A Tenderness on No N/A N/A Palpation: Wound Preparation: Ulcer Cleansing: N/A N/A Rinsed/Irrigated with Saline Topical Anesthetic Applied: Other: lidocaine 4% Treatment Notes Electronic Signature(s) Signed: 09/09/2015 2:18:57 PM By: Elpidio Eric BSN, RN Entered By: Elpidio Eric on 09/09/2015 10:13:58 [name], Marisa RMarland Kitchen ([id number]) -------------------------------------------------------------------------------- Multi-Disciplinary Care Plan Details Patient Name: Marisa [name], [name] Date of Service: 09/09/2015 10:00 AM Medical Record Number: [id number] Patient Account Number: [id number] Date of Birth/Sex: 1923-08-02 (82 y.o. Female) Treating RN: Afful, RN, BSN, [place] Sink Primary Care Physician: Jodi Mourning Other Clinician: Referring Physician: Jodi Mourning Treating Physician/Extender: Rudene Re in Treatment: 1 Active Inactive Abuse / Safety / Falls / Self Care Management Nursing Diagnoses: Potential for falls Goals: Patient will remain injury free Date Initiated: 09/02/2015 Goal Status: Active Interventions: Assess fall risk on admission and as needed Notes: Nutrition Nursing Diagnoses: Imbalanced  nutrition Goals: Patient/caregiver agrees to and verbalizes understanding of need to obtain nutritional consultation Date Initiated: 09/02/2015 Goal Status: Active Interventions: Provide education on nutrition Notes: Orientation to the Wound Care Program Nursing Diagnoses: Knowledge deficit related to the wound healing center program Goals: Patient/caregiver will verbalize understanding of the Wound Healing Center Program Date Initiated: 09/02/2015 NIYONNA, BETSILL ([id number]) Goal Status: Active Interventions: Provide education on orientation to the wound center Notes: Wound/Skin Impairment Nursing Diagnoses: Impaired tissue integrity Goals: Ulcer/skin breakdown will heal within 14 weeks Date Initiated: 09/02/2015 Goal Status: Active Interventions: Assess patient/caregiver ability to obtain necessary supplies Notes: Electronic Signature(s) Signed: 09/09/2015 2:18:57 PM By: Elpidio Eric BSN, RN Entered By: Elpidio Eric on 09/09/2015 10:04:52 Petite, Lisandra R. ([id number]) -------------------------------------------------------------------------------- Pain Assessment Details Patient Name: Marisa [name], [name] Date of Service: 09/09/2015 10:00 AM Medical Record Number: [id number] Patient Account Number: [id number] Date of Birth/Sex: 1923-12-12 (82 y.o. Female) Treating RN: Clover Mealy, RN, BSN, [place] Sink Primary Care Physician: Jodi Mourning Other Clinician: Referring Physician: Jodi Mourning Treating Physician/Extender: Rudene Re in Treatment: 1 Active Problems Location of Pain Severity and Description of Pain Patient Has Paino No Site Locations With Dressing Change: No Pain Management and Medication Current Pain Management: Electronic Signature(s) Signed: 09/09/2015 2:18:57 PM By: Elpidio Eric BSN, RN Entered By: Elpidio Eric on 09/09/2015 09:58:04 Fjeld, Kearstin R.  ([id number]) -------------------------------------------------------------------------------- Patient/Caregiver Education Details Patient Name: Marisa [name], [name] Date of Service: 09/09/2015 10:00 AM Medical Record Number: [id number] Patient Account Number: [id number] Date of Birth/Gender: January 08, 1924 (82 y.o. Female) Treating RN: Clover Mealy, RN, BSN, [place] Sink Primary Care Physician: Jodi Mourning Other Clinician: Referring Physician: Jodi Mourning Treating Physician/Extender: Rudene Re in Treatment: 1 Education Assessment Education Provided To: Patient and Caregiver HHRN Education Topics Provided Nutrition: Methods: Explain/Verbal Responses: State content correctly Welcome To The [facility]: Methods: Explain/Verbal Responses: State content correctly Electronic Signature(s) Signed: 09/09/2015 2:18:57 PM By: Elpidio Eric BSN, RN Entered By: Elpidio Eric on 09/09/2015 10:20:38 Sanfilippo, [name] ([id number]) -------------------------------------------------------------------------------- Wound Assessment Details Patient Name: Perlow, [name] Date of Service: 09/09/2015 10:00 AM Medical Record Number: [id number] Patient Account Number: [id number] Date of Birth/Sex: [birth year] (82 y.o. Female) Treating RN: Afful, RN, BSN, [place] Sink Primary Care Physician: Jodi Mourning Other Clinician: Referring Physician: Jodi Mourning Treating Physician/Extender: Rudene Re in Treatment: 1 Wound Status Wound Number: 1 Primary Abscess Etiology: Wound Location: Right Gluteus Wound Open Wounding Event: Bump Status: Date Acquired: 08/26/2015 Comorbid Chronic Obstructive Pulmonary Disease Weeks Of Treatment: 1 History: (COPD), Congestive Heart Failure, Clustered Wound: No Hypertension, Osteoarthritis Photos Photo Uploaded By: Elpidio Eric on 09/09/2015 14:18:31 Wound Measurements Length: (cm)  0.6 Width: (cm) 0.8 Depth: (cm) 0.1 Area: (cm) 0.377 Volume: (cm) 0.038 %  Reduction in Area: 20% % Reduction in Volume: 59.6% Epithelialization: None Tunneling: No Undermining: No Wound Description Classification: Partial Thickness Wound Margin: Distinct, outline attached Exudate Amount: Medium Exudate Type: Serosanguineous Exudate Color: red, brown Foul Odor After Cleansing: No Wound Bed Granulation Amount: None Present (0%) Exposed Structure Necrotic Amount: Large (67-100%) Fascia Exposed: No Necrotic Quality: Adherent Slough Fat Layer Exposed: No Tendon Exposed: No Kruckenberg, Tennile R. ([id number]) Muscle Exposed: No Joint Exposed: No Bone Exposed: No Limited to Skin Breakdown Periwound Skin Texture Texture Color No Abnormalities Noted: No No Abnormalities Noted: No Callus: No Atrophie Blanche: No Crepitus: No Cyanosis: No Excoriation: No Ecchymosis: No Fluctuance: No Erythema: No Friable: No Hemosiderin Staining: No Induration: No Mottled: No Localized Edema: No Pallor: No Rash: No Rubor: No Scarring: No Temperature / Pain Moisture Temperature: No Abnormality No Abnormalities Noted: No Dry / Scaly: No Maceration: No Moist: Yes Wound Preparation Ulcer Cleansing: Rinsed/Irrigated with Saline Topical Anesthetic Applied: Other: lidocaine 4%, Treatment Notes Wound #1 (Right Gluteus) 1. Cleansed with: Clean wound with Normal Saline 3. Peri-wound Care: Skin Prep 4. Dressing Applied: Aquacel Ag 5. Secondary Dressing Applied Bordered Foam Dressing Electronic Signature(s) Signed: 09/09/2015 2:18:57 PM By: Elpidio EricAfful, Rita BSN, RN Entered By: Elpidio EricAfful, Rita on 09/09/2015 10:03:36 Cavins, [name] ([id number]) -------------------------------------------------------------------------------- Vitals Details Patient Name: Marisa [name], [name] Date of Service: 09/09/2015 10:00 AM Medical Record Number: [id number] Patient Account Number: [id number] Date of Birth/Sex: 01/30/1924 (82 y.o. Female) Treating RN: Afful, RN, BSN,  [place] Primary Care  Physician: Jodi MourningWalker III, John Other Clinician: Referring Physician: Jodi MourningWalker III, John Treating Physician/Extender: Rudene ReBritto, Errol Weeks in Treatment: 1 Vital Signs Time Taken: 09:58 Temperature (F): 97.8 Height (in): 60 Pulse (bpm): 75 Weight (lbs): 195 Respiratory Rate (breaths/min): 18 Body Mass Index (BMI): 38.1 Blood Pressure (mmHg): 179/99 Reference Range: 80 - 120 mg / dl Electronic Signature(s) Signed: 09/09/2015 2:18:57 PM By: Elpidio EricAfful, Rita BSN, RN Entered By: Elpidio EricAfful, Rita on 09/09/2015 10:00:13

## 2015-09-09 NOTE — Progress Notes (Signed)
Marisa, [name] ([id number]) Visit Report for 09/09/2015 Chief Complaint Document Details Patient Name: Marisa [name], Marisa [name] 09/09/2015 10:00 Date of Service: AM Medical Record [id number] Number: Patient Account Number: [id number] 17-Jul-1923 (82 y.o. Treating RN: Afful, RN, BSN, [place] Sink Date of Birth/Sex: Female) Other Clinician: Primary Care Physician: Kennieth Francois Referring Physician: Jodi Mourning Physician/Extender: Weeks in Treatment: 1 Information Obtained from: Patient Chief Complaint Patient presents to the wound care center for a consult due non healing wound to the right gluteal area which she's had for about 3 weeks. Electronic Signature(s) Signed: 09/09/2015 10:18:01 AM By: Evlyn Kanner MD, FACS Entered By: Evlyn Kanner on 09/09/2015 10:18:01 Gordy Savers ([id number]) -------------------------------------------------------------------------------- Debridement Details Patient Name: Marisa, [name] 09/09/2015 10:00 Date of Service: AM Medical Record [id number] Number: Patient Account Number: [id number] May 08, 1923 (82 y.o. Treating RN: Afful, RN, BSN, [place] Sink Date of Birth/Sex: Female) Other Clinician: Primary Care Physician: Danne Harbor, John Treating Chere Babson Referring Physician: Jodi Mourning Physician/Extender: Weeks in Treatment: 1 Debridement Performed for Wound #1 Right Gluteus Assessment: Performed By: Physician Evlyn Kanner, MD Debridement: Debridement Pre-procedure Yes Verification/Time Out Taken: Start Time: 10:12 Pain Control: Lidocaine 4% Topical Solution Level: Skin/Subcutaneous Tissue Total Area Debrided (L x 0.8 (cm) x 0.8 (cm) = 0.64 (cm) W): Tissue and other Non-Viable, Fibrin/Slough, Subcutaneous material debrided: Instrument: Curette Bleeding: Minimum Hemostasis Achieved: Pressure End Time: 10:15 Procedural Pain: 0 Post Procedural Pain: 0 Response to Treatment: Procedure was tolerated well Post  Debridement Measurements of Total Wound Length: (cm) 0.8 Width: (cm) 0.8 Depth: (cm) 0.1 Volume: (cm) 0.05 Post Procedure Diagnosis Same as Pre-procedure Electronic Signature(s) Signed: 09/09/2015 10:17:53 AM By: Evlyn Kanner MD, FACS Signed: 09/09/2015 2:18:57 PM By: Elpidio Eric BSN, RN Entered By: Evlyn Kanner on 09/09/2015 10:17:53 Yankovich, Aigner R. ([id number]) -------------------------------------------------------------------------------- HPI Details Patient Name: Marisa, [name] 09/09/2015 10:00 Date of Service: AM Medical Record [id number] Number: Patient Account Number: [id number] Jan 07, 1924 (82 y.o. Treating RN: Clover Mealy, RN, BSN, [place] Sink Date of Birth/Sex: Female) Other Clinician: Primary Care Physician: Kennieth Francois Referring Physician: Jodi Mourning Physician/Extender: Weeks in Treatment: 1 History of Present Illness Location: right gluteal area ulcer Quality: Patient reports experiencing a dull pain to affected area(s). Severity: Patient states wound are getting worse. Duration: Patient has had the wound for < 3 weeks prior to presenting for treatment Timing: Pain in wound is Intermittent (comes and goes Context: The wound would happen gradually Modifying Factors: Other treatment(s) tried include:Diflucan and some Lotrisone cream Associated Signs and Symptoms: Patient reports having difficulty sitting for long periods. HPI Description: 82 year old patient who was seen by the PA at the internal medicine practice [facility]. The patient has been referred to others with what looks like a fungal infection in the perineal area with some open wound which was referred for an opinion. The patient's past medical history significant for hiatal hernia, osteoarthritis, status post appendectomy, cholecystectomy, knee arthroscopy. The patient is not been a smoker. The patient was put on Diflucan 100 mg daily for 3 days and was asked to use  Lotrisone cream twice daily. Moisture barrier was also recommended and off loading was suggested. the ulcerated areas on the right gluteal region and started off as a bump and then opened out into an ulcer. Electronic Signature(s) Signed: 09/09/2015 10:18:05 AM By: Evlyn Kanner MD, FACS Entered By: Evlyn Kanner on 09/09/2015 10:18:05 Gordy Savers ([id number]) -------------------------------------------------------------------------------- Physical Exam Details Patient Name: [name], [name] 09/09/2015 10:00 Date of  Service: AM Medical Record [id number] Number: Patient Account Number: [id number] 11/20/1923 (82 y.o. Treating RN: Clover Mealy, RN, BSN, [place] Sink Date of Birth/Sex: Female) Other Clinician: Primary Care Physician: Kennieth Francois Referring Physician: Jodi Mourning Physician/Extender: Weeks in Treatment: 1 Constitutional . Pulse regular. Respirations normal and unlabored. Afebrile. . Eyes Nonicteric. Reactive to light. Ears, Nose, Mouth, and Throat Lips, teeth, and gums WNL.Marland Kitchen Moist mucosa without lesions. Neck supple and nontender. No palpable supraclavicular or cervical adenopathy. Normal sized without goiter. Respiratory WNL. No retractions.. Cardiovascular Pedal Pulses WNL. No clubbing, cyanosis or edema. Lymphatic No adneopathy. No adenopathy. No adenopathy. Musculoskeletal Adexa without tenderness or enlargement.. Digits and nails w/o clubbing, cyanosis, infection, petechiae, ischemia, or inflammatory conditions.. Integumentary (Hair, Skin) No suspicious lesions. No crepitus or fluctuance. No peri-wound warmth or erythema. No masses.Marland Kitchen Psychiatric Judgement and insight Intact.. No evidence of depression, anxiety, or agitation.. Notes the wound on the right gluteal area has minimal amount of subcutaneous debris and using a #3 curet sharply removed all this debris and there is brisk bleeding and this was controlled with pressure. Electronic  Signature(s) Signed: 09/09/2015 10:18:40 AM By: Evlyn Kanner MD, FACS Entered By: Evlyn Kanner on 09/09/2015 10:18:39 Gordy Savers ([id number]) -------------------------------------------------------------------------------- Physician Orders Details Patient Name: ZARIEL, CAPANO R. 09/09/2015 10:00 Date of Service: AM Medical Record [id number] Number: Patient Account Number: [id number] [birth year] (82 y.o. Treating RN: Clover Mealy, RN, BSN, [place] Sink Date of Birth/Sex: Female) Other Clinician: Primary Care Physician: Kennieth Francois Referring Physician: Jodi Mourning Physician/Extender: Tania Ade in Treatment: 1 Verbal / Phone Orders: Yes Clinician: Afful, RN, BSN, Rita Read Back and Verified: Yes Diagnosis Coding Wound Cleansing Wound #1 Right Gluteus o Clean wound with Normal Saline. Anesthetic Wound #1 Right Gluteus o Topical Lidocaine 4% cream applied to wound bed prior to debridement Primary Wound Dressing Wound #1 Right Gluteus o Aquacel Ag Secondary Dressing Wound #1 Right Gluteus o Boardered Foam Dressing Dressing Change Frequency Wound #1 Right Gluteus o Change Dressing Monday, Wednesday, Friday - Monday in wound care center Follow-up Appointments Wound #1 Right Gluteus o Return Appointment in 1 week. Off-Loading Wound #1 Right Gluteus o Turn and reposition every 2 hours Additional Orders / Instructions Wound #1 Right Gluteus o Increase protein intake. Yari, Szeliga Malaiah RMarland Kitchen ([id number]) Home Health Wound #1 Right Gluteus o Continue Home Health Visits o Home Health Nurse may visit PRN to address patientos wound care needs. o FACE TO FACE ENCOUNTER: MEDICARE and MEDICAID PATIENTS: I certify that this patient is under my care and that I had a face-to-face encounter that meets the physician face-to-face encounter requirements with this patient on this date. The encounter with the patient was in whole or in part for the following  MEDICAL CONDITION: (primary reason for Home Healthcare) MEDICAL NECESSITY: I certify, that based on my findings, NURSING services are a medically necessary home health service. HOME BOUND STATUS: I certify that my clinical findings support that this patient is homebound (i.e., Due to illness or injury, pt requires aid of supportive devices such as crutches, cane, wheelchairs, walkers, the use of special transportation or the assistance of another person to leave their place of residence. There is a normal inability to leave the home and doing so requires considerable and taxing effort. Other absences are for medical reasons / religious services and are infrequent or of short duration when for other reasons). o If current dressing causes regression in wound condition, may D/C ordered dressing product/s and apply Normal  Saline Moist Dressing daily until next Wound Healing Center / Other MD appointment. Notify Wound Healing Center of regression in wound condition at [phone]. Electronic Signature(s) Signed: 09/09/2015 2:18:57 PM By: Elpidio Eric BSN, RN Signed: 09/09/2015 5:05:24 PM By: Evlyn Kanner MD, FACS Entered By: Elpidio Eric on 09/09/2015 10:17:13 Raimer, Autumm RMarland Kitchen ([id number]) -------------------------------------------------------------------------------- Problem List Details Patient Name: CINDI, GHAZARIAN R. 09/09/2015 10:00 Date of Service: AM Medical Record [id number] Number: Patient Account Number: [id number] Dec 31, 1923 (82 y.o. Treating RN: Clover Mealy, RN, BSN, [place] Sink Date of Birth/Sex: Female) Other Clinician: Primary Care Physician: Kennieth Francois Referring Physician: Jodi Mourning Physician/Extender: Tania Ade in Treatment: 1 Active Problems ICD-10 Encounter Code Description Active Date Diagnosis S31.819A Unspecified open wound of right buttock, initial encounter 09/02/2015 Yes L02.32 Furuncle of buttock 09/02/2015 Yes Inactive Problems Resolved  Problems Electronic Signature(s) Signed: 09/09/2015 10:17:40 AM By: Evlyn Kanner MD, FACS Entered By: Evlyn Kanner on 09/09/2015 10:17:40 Bickle, Kaedynce R. ([id number]) -------------------------------------------------------------------------------- Progress Note Details Patient Name: [name] 09/09/2015 10:00 Date of Service: AM Medical Record [id number] Number: Patient Account Number: [id number] 11-Jul-1923 (82 y.o. Treating RN: Afful, RN, BSN, [place] Sink Date of Birth/Sex: Female) Other Clinician: Primary Care Physician: Kennieth Francois Referring Physician: Jodi Mourning Physician/Extender: Weeks in Treatment: 1 Subjective Chief Complaint Information obtained from Patient Patient presents to the wound care center for a consult due non healing wound to the right gluteal area which she's had for about 3 weeks. History of Present Illness (HPI) The following HPI elements were documented for the patient's wound: Location: right gluteal area ulcer Quality: Patient reports experiencing a dull pain to affected area(s). Severity: Patient states wound are getting worse. Duration: Patient has had the wound for < 3 weeks prior to presenting for treatment Timing: Pain in wound is Intermittent (comes and goes Context: The wound would happen gradually Modifying Factors: Other treatment(s) tried include:Diflucan and some Lotrisone cream Associated Signs and Symptoms: Patient reports having difficulty sitting for long periods. 82 year old patient who was seen by the PA at the internal medicine practice [facility]. The patient has been referred to others with what looks like a fungal infection in the perineal area with some open wound which was referred for an opinion. The patient's past medical history significant for hiatal hernia, osteoarthritis, status post appendectomy, cholecystectomy, knee arthroscopy. The patient is not been a smoker. The patient was  put on Diflucan 100 mg daily for 3 days and was asked to use Lotrisone cream twice daily. Moisture barrier was also recommended and off loading was suggested. the ulcerated areas on the right gluteal region and started off as a bump and then opened out into an ulcer. Objective Constitutional Pulse regular. Respirations normal and unlabored. Afebrile. Vitals Time Taken: 9:58 AM, Height: 60 in, Weight: 195 lbs, BMI: 38.1, Temperature: 97.8 F, Pulse: 75 Feig, Yaretzi R. ([id number]) bpm, Respiratory Rate: 18 breaths/min, Blood Pressure: 179/99 mmHg. Eyes Nonicteric. Reactive to light. Ears, Nose, Mouth, and Throat Lips, teeth, and gums WNL.Marland Kitchen Moist mucosa without lesions. Neck supple and nontender. No palpable supraclavicular or cervical adenopathy. Normal sized without goiter. Respiratory WNL. No retractions.. Cardiovascular Pedal Pulses WNL. No clubbing, cyanosis or edema. Lymphatic No adneopathy. No adenopathy. No adenopathy. Musculoskeletal Adexa without tenderness or enlargement.. Digits and nails w/o clubbing, cyanosis, infection, petechiae, ischemia, or inflammatory conditions.Marland Kitchen Psychiatric Judgement and insight Intact.. No evidence of depression, anxiety, or agitation.. General Notes: the wound on the right gluteal area has minimal amount of subcutaneous  debris and using a #3 curet sharply removed all this debris and there is brisk bleeding and this was controlled with pressure. Integumentary (Hair, Skin) No suspicious lesions. No crepitus or fluctuance. No peri-wound warmth or erythema. No masses.. Wound #1 status is Open. Original cause of wound was Bump. The wound is located on the Right Gluteus. The wound measures 0.6cm length x 0.8cm width x 0.1cm depth; 0.377cm^2 area and 0.038cm^3 volume. The wound is limited to skin breakdown. There is no tunneling or undermining noted. There is a medium amount of serosanguineous drainage noted. The wound margin is distinct with the  outline attached to the wound base. There is no granulation within the wound bed. There is a large (67-100%) amount of necrotic tissue within the wound bed including Adherent Slough. The periwound skin appearance exhibited: Moist. The periwound skin appearance did not exhibit: Callus, Crepitus, Excoriation, Fluctuance, Friable, Induration, Localized Edema, Rash, Scarring, Dry/Scaly, Maceration, Atrophie Blanche, Cyanosis, Ecchymosis, Hemosiderin Staining, Mottled, Pallor, Rubor, Erythema. Periwound temperature was noted as No Abnormality. Bonk, Natalea R. ([id number]) Assessment Active Problems ICD-10 S31.819A - Unspecified open wound of right buttock, initial encounter L02.32 - Furuncle of buttock Procedures Wound #1 Wound #1 is an Abscess located on the Right Gluteus . There was a Skin/Subcutaneous Tissue Debridement ([id number]) debridement with total area of 0.64 sq cm performed by Evlyn Kanner, MD. with the following instrument(s): Curette to remove Non-Viable tissue/material including Fibrin/Slough and Subcutaneous after achieving pain control using Lidocaine 4% Topical Solution. A time out was conducted prior to the start of the procedure. A Minimum amount of bleeding was controlled with Pressure. The procedure was tolerated well with a pain level of 0 throughout and a pain level of 0 following the procedure. Post Debridement Measurements: 0.8cm length x 0.8cm width x 0.1cm depth; 0.05cm^3 volume. Post procedure Diagnosis Wound #1: Same as Pre-Procedure Plan Wound Cleansing: Wound #1 Right Gluteus: Clean wound with Normal Saline. Anesthetic: Wound #1 Right Gluteus: Topical Lidocaine 4% cream applied to wound bed prior to debridement Primary Wound Dressing: Wound #1 Right Gluteus: Aquacel Ag Secondary Dressing: Wound #1 Right Gluteus: Boardered Foam Dressing Dressing Change Frequency: Wound #1 Right Gluteus: Change Dressing Monday, Wednesday, Friday - Monday in wound care  center Follow-up Appointments: Wound #1 Right Gluteus: Buxton, Daylene R. ([id number]) Return Appointment in 1 week. Off-Loading: Wound #1 Right Gluteus: Turn and reposition every 2 hours Additional Orders / Instructions: Wound #1 Right Gluteus: Increase protein intake. Home Health: Wound #1 Right Gluteus: Continue Home Health Visits Home Health Nurse may visit PRN to address patient s wound care needs. FACE TO FACE ENCOUNTER: MEDICARE and MEDICAID PATIENTS: I certify that this patient is under my care and that I had a face-to-face encounter that meets the physician face-to-face encounter requirements with this patient on this date. The encounter with the patient was in whole or in part for the following MEDICAL CONDITION: (primary reason for Home Healthcare) MEDICAL NECESSITY: I certify, that based on my findings, NURSING services are a medically necessary home health service. HOME BOUND STATUS: I certify that my clinical findings support that this patient is homebound (i.e., Due to illness or injury, pt requires aid of supportive devices such as crutches, cane, wheelchairs, walkers, the use of special transportation or the assistance of another person to leave their place of residence. There is a normal inability to leave the home and doing so requires considerable and taxing effort. Other absences are for medical reasons / religious services and are infrequent or  of short duration when for other reasons). If current dressing causes regression in wound condition, may D/C ordered dressing product/s and apply Normal Saline Moist Dressing daily until next Wound Healing Center / Other MD appointment. Notify Wound Healing Center of regression in wound condition at [phone]. This stage I have recommended: 1. Silver alginate and a bordered foam to be changed as required daily or every other day. 2. Offloading as much as possible -- she says she's been compliant with this 3. See me back in  a week's time for follow-up. Electronic Signature(s) Signed: 09/09/2015 10:19:02 AM By: Evlyn KannerBritto, Vyron Fronczak MD, FACS Entered By: Evlyn KannerBritto, Dacian Orrico on 09/09/2015 10:19:02 Arriaga, Sameria R. ([id number]) -------------------------------------------------------------------------------- SuperBill Details Patient Name: Sunny SchleinWOOTEN, Terrian R. Date of Service: 09/09/2015 Medical Record Patient Account Number: [id number] Number: Afful, RN, BSN, Treating RN: 1923/12/30 (82 y.o. [place] Date of Birth/Sex: Female) Other Clinician: Primary Care Physician: Kennieth FrancoisWalker III, John Treating Amica Harron Referring Physician: Jodi MourningWalker III, John Physician/Extender: Tania AdeWeeks in Treatment: 1 Diagnosis Coding ICD-10 Codes Code Description [phone] Unspecified open wound of right buttock, initial encounter L02.32 Furuncle of buttock Facility Procedures CPT4 Code: [id number] Description: 11042 - DEB SUBQ TISSUE 20 SQ CM/< ICD-10 Description Diagnosis S31.819A Unspecified open wound of right buttock, initial L02.32 Furuncle of buttock Modifier: encounter Quantity: 1 Physician Procedures CPT4 Code: [id number] Description: 11042 - WC PHYS SUBQ TISS 20 SQ CM ICD-10 Description Diagnosis S31.819A Unspecified open wound of right buttock, initial L02.32 Furuncle of buttock Modifier: encounter Quantity: 1 Electronic Signature(s) Signed: 09/09/2015 10:19:17 AM By: Evlyn KannerBritto, Celene Pippins MD, FACS Entered By: Evlyn KannerBritto, Berton Butrick on 09/09/2015 10:19:16

## 2015-09-12 LAB — TOXASSURE SELECT 13 (MW), URINE: PDF: 0

## 2015-09-12 NOTE — Progress Notes (Signed)
Quick Note:  Reviewed. ______ 

## 2015-09-16 ENCOUNTER — Encounter: Payer: Medicare Other | Admitting: Surgery

## 2015-09-16 DIAGNOSIS — S31819A Unspecified open wound of right buttock, initial encounter: Secondary | ICD-10-CM | POA: Diagnosis not present

## 2015-09-16 NOTE — Progress Notes (Addendum)
Marisa, [name] ([id number]) Visit Report for 09/16/2015 Arrival Information Details Patient Name: Marisa [name], Marisa [name]. Date of Service: 09/16/2015 10:45 AM Medical Record Number: [id number] Patient Account Number: [id number] Date of Birth/Sex: 04-11-1923 (82 y.o. Female) Treating RN: Afful, RN, BSN, [place] Sink Primary Care Physician: Jodi Mourning Other Clinician: Referring Physician: Jodi Mourning Treating Physician/Extender: Rudene Re in Treatment: 2 Visit Information History Since Last Visit Added or deleted any medications: No Patient Arrived: Walker Any new allergies or adverse reactions: No Arrival Time: 11:03 Had a fall or experienced change in No Accompanied By: dtr activities of daily living that may affect Transfer Assistance: None risk of falls: Patient Identification Verified: Yes Signs or symptoms of abuse/neglect since last No Secondary Verification Process Yes visito Completed: Hospitalized since last visit: No Patient Requires Transmission- No Has Dressing in Place as Prescribed: Yes Based Precautions: Pain Present Now: No Patient Has Alerts: Yes Patient Alerts: Patient on Blood Thinner Aspirin Electronic Signature(s) Signed: 09/16/2015 3:30:01 PM By: Elpidio Eric BSN, RN Entered By: Elpidio Eric on 09/16/2015 11:03:25 Dickmann, Karilynn R. ([id number]) -------------------------------------------------------------------------------- Encounter Discharge Information Details Patient Name: Marisa [name], Keylie R. Date of Service: 09/16/2015 10:45 AM Medical Record Number: [id number] Patient Account Number: [id number] Date of Birth/Sex: [birth year] (82 y.o. Female) Treating RN: Clover Mealy, RN, BSN, [place] Sink Primary Care Physician: Jodi Mourning Other Clinician: Referring Physician: Jodi Mourning Treating Physician/Extender: Rudene Re in Treatment: 2 Encounter Discharge Information Items Discharge Pain Level: 0 Discharge Condition: Stable Ambulatory Status:  Walker Discharge Destination: Home Transportation: Private Auto Accompanied By: dtr Schedule Follow-up Appointment: No Medication Reconciliation completed No and provided to Patient/Care Lennard Capek: Provided on Clinical Summary of Care: 09/16/2015 Form Type Recipient Paper Patient GW Electronic Signature(s) Signed: 09/16/2015 3:30:01 PM By: Elpidio Eric BSN, RN Previous Signature: 09/16/2015 11:26:25 AM Version By: Gwenlyn Perking Entered By: Elpidio Eric on 09/16/2015 11:50:31 Jablonsky, Lindora R. ([id number]) -------------------------------------------------------------------------------- Lower Extremity Assessment Details Patient Name: [name], [name] Date of Service: 09/16/2015 10:45 AM Medical Record Number: [id number] Patient Account Number: [id number] Date of Birth/Sex: February 27, 1924 (82 y.o. Female) Treating RN: Afful, RN, BSN, [place] Sink Primary Care Physician: Jodi Mourning Other Clinician: Referring Physician: Jodi Mourning Treating Physician/Extender: Rudene Re in Treatment: 2 Electronic Signature(s) Signed: 09/16/2015 3:30:01 PM By: Elpidio Eric BSN, RN Entered By: Elpidio Eric on 09/16/2015 11:05:47 Holloman, Emie R. ([id number]) -------------------------------------------------------------------------------- Multi Wound Chart Details Patient Name: [name], [name] Date of Service: 09/16/2015 10:45 AM Medical Record Number: [id number] Patient Account Number: [id number] Date of Birth/Sex: [birth year] (82 y.o. Female) Treating RN: Clover Mealy, RN, BSN, [place] Sink Primary Care Physician: Jodi Mourning Other Clinician: Referring Physician: Jodi Mourning Treating Physician/Extender: Rudene Re in Treatment: 2 Vital Signs Height(in): 60 Pulse(bpm): 76 Weight(lbs): 195 Blood Pressure 174/69 (mmHg): Body Mass Index(BMI): 38 Temperature(F): 97.8 Respiratory Rate 18 (breaths/min): Photos: [1:No Photos] [N/A:N/A] Wound Location: [1:Right Gluteus] [N/A:N/A] Wounding  Event: [1:Bump] [N/A:N/A] Primary Etiology: [1:Abscess] [N/A:N/A] Comorbid History: [1:Chronic Obstructive Pulmonary Disease (COPD), Congestive Heart Failure, Hypertension, Osteoarthritis] [N/A:N/A] Date Acquired: [1:08/26/2015] [N/A:N/A] Weeks of Treatment: [1:2] [N/A:N/A] Wound Status: [1:Open] [N/A:N/A] Measurements L x W x D 1x0.7x0.1 [N/A:N/A] (cm) Area (cm) : [1:0.55] [N/A:N/A] Volume (cm) : [1:0.055] [N/A:N/A] % Reduction in Area: [1:-16.80%] [N/A:N/A] % Reduction in Volume: 41.50% [N/A:N/A] Classification: [1:Partial Thickness] [N/A:N/A] Exudate Amount: [1:Medium] [N/A:N/A] Exudate Type: [1:Serosanguineous] [N/A:N/A] Exudate Color: [1:red, brown] [N/A:N/A] Wound Margin: [1:Distinct, outline attached] [N/A:N/A] Granulation Amount: [1:Medium (34-66%)] [N/A:N/A] Granulation Quality: [1:Pink, Pale] [N/A:N/A] Necrotic Amount: [1:Medium (34-66%)] [N/A:N/A] Exposed Structures: [  1:Fascia: No Fat: No Tendon: No] [N/A:N/A] Muscle: No Joint: No Bone: No Limited to Skin Breakdown Epithelialization: None N/A N/A Periwound Skin Texture: Edema: No N/A N/A Excoriation: No Induration: No Callus: No Crepitus: No Fluctuance: No Friable: No Rash: No Scarring: No Periwound Skin Moist: Yes N/A N/A Moisture: Maceration: No Dry/Scaly: No Periwound Skin Color: Atrophie Blanche: No N/A N/A Cyanosis: No Ecchymosis: No Erythema: No Hemosiderin Staining: No Mottled: No Pallor: No Rubor: No Temperature: No Abnormality N/A N/A Tenderness on No N/A N/A Palpation: Wound Preparation: Ulcer Cleansing: N/A N/A Rinsed/Irrigated with Saline Topical Anesthetic Applied: Other: lidocaine 4% Treatment Notes Electronic Signature(s) Signed: 09/16/2015 3:30:01 PM By: Elpidio EricAfful, Rita BSN, RN Entered By: Elpidio EricAfful, Rita on 09/16/2015 11:20:52 [name], [name] ([id number]) -------------------------------------------------------------------------------- Multi-Disciplinary Care Plan Details Patient  Name: Marisa SchleinWOOTEN, [name] Date of Service: 09/16/2015 10:45 AM Medical Record Number: [id number] Patient Account Number: [id number] Date of Birth/Sex: [birth year] (82 y.o. Female) Treating RN: Afful, RN, BSN, [place] Sinkita Primary Care Physician: Jodi MourningWalker III, John Other Clinician: Referring Physician: Jodi MourningWalker III, John Treating Physician/Extender: Rudene ReBritto, Errol Weeks in Treatment: 2 Active Inactive Abuse / Safety / Falls / Self Care Management Nursing Diagnoses: Potential for falls Goals: Patient will remain injury free Date Initiated: 09/02/2015 Goal Status: Active Interventions: Assess fall risk on admission and as needed Notes: Nutrition Nursing Diagnoses: Imbalanced nutrition Goals: Patient/caregiver agrees to and verbalizes understanding of need to obtain nutritional consultation Date Initiated: 09/02/2015 Goal Status: Active Interventions: Provide education on nutrition Treatment Activities: Education provided on Nutrition : 09/09/2015 Notes: Orientation to the Wound Care Program Nursing Diagnoses: Knowledge deficit related to the wound healing center program ZincWOOTEN, Hailei R. ([id number]) Goals: Patient/caregiver will verbalize understanding of the Wound Healing Center Program Date Initiated: 09/02/2015 Goal Status: Active Interventions: Provide education on orientation to the wound center Notes: Wound/Skin Impairment Nursing Diagnoses: Impaired tissue integrity Goals: Ulcer/skin breakdown will heal within 14 weeks Date Initiated: 09/02/2015 Goal Status: Active Interventions: Assess patient/caregiver ability to obtain necessary supplies Notes: Electronic Signature(s) Signed: 09/16/2015 3:30:01 PM By: Elpidio EricAfful, Rita BSN, RN Entered By: Elpidio EricAfful, Rita on 09/16/2015 11:20:45 Aguas, Meshell R. ([id number]) -------------------------------------------------------------------------------- Pain Assessment Details Patient Name: Marisa SchleinWOOTEN, Mckennah R. Date of Service: 09/16/2015 10:45 AM Medical  Record Number: [id number] Patient Account Number: [id number] Date of Birth/Sex: [birth year] (82 y.o. Female) Treating RN: Clover MealyAfful, RN, BSN, [place] Sinkita Primary Care Physician: Jodi MourningWalker III, John Other Clinician: Referring Physician: Jodi MourningWalker III, John Treating Physician/Extender: Rudene ReBritto, Errol Weeks in Treatment: 2 Active Problems Location of Pain Severity and Description of Pain Patient Has Paino Yes Site Locations Pain Location: Pain in Ulcers With Dressing Change: No Rate the pain. Current Pain Level: 4 Pain Management and Medication Current Pain Management: Electronic Signature(s) Signed: 09/16/2015 3:30:01 PM By: Elpidio EricAfful, Rita BSN, RN Entered By: Elpidio EricAfful, Rita on 09/16/2015 11:04:58 Fettig, Johnita R. ([id number]) -------------------------------------------------------------------------------- Patient/Caregiver Education Details Patient Name: Marisa SchleinWOOTEN, Rafaela R. Date of Service: 09/16/2015 10:45 AM Medical Record Number: [id number] Patient Account Number: [id number] Date of Birth/Gender: [birth year] (82 y.o. Female) Treating RN: Clover MealyAfful, RN, BSN, [place] Sinkita Primary Care Physician: Jodi MourningWalker III, John Other Clinician: Referring Physician: Jodi MourningWalker III, John Treating Physician/Extender: Rudene ReBritto, Errol Weeks in Treatment: 2 Education Assessment Education Provided To: Patient Education Topics Provided Nutrition: Methods: Explain/Verbal Responses: State content correctly Welcome To The Wound Care Center: Methods: Explain/Verbal Responses: State content correctly Electronic Signature(s) Signed: 09/16/2015 3:30:01 PM By: Elpidio EricAfful, Rita BSN, RN Entered By: Elpidio EricAfful, Rita on 09/16/2015 11:50:54 Pint, Ula R. ([id number]) -------------------------------------------------------------------------------- Wound Assessment Details Patient Name: Dube, Jakaila R. Date of  Service: 09/16/2015 10:45 AM Medical Record Number: [id number] Patient Account Number: [id number] Date of Birth/Sex: 1924/02/23 (82 y.o. Female) Treating RN:  Afful, RN, BSN, [place] Sinkita Primary Care Physician: Jodi MourningWalker III, John Other Clinician: Referring Physician: Jodi MourningWalker III, John Treating Physician/Extender: Rudene ReBritto, Errol Weeks in Treatment: 2 Wound Status Wound Number: 1 Primary Abscess Etiology: Wound Location: Right Gluteus Wound Open Wounding Event: Bump Status: Date Acquired: 08/26/2015 Comorbid Chronic Obstructive Pulmonary Disease Weeks Of Treatment: 2 History: (COPD), Congestive Heart Failure, Clustered Wound: No Hypertension, Osteoarthritis Photos Photo Uploaded By: Elpidio EricAfful, Rita on 09/16/2015 15:29:10 Wound Measurements Length: (cm) 1 Width: (cm) 0.7 Depth: (cm) 0.1 Area: (cm) 0.55 Volume: (cm) 0.055 % Reduction in Area: -16.8% % Reduction in Volume: 41.5% Epithelialization: None Tunneling: No Undermining: No Wound Description Classification: Partial Thickness Wound Margin: Distinct, outline attached Exudate Amount: Medium Exudate Type: Serosanguineous Exudate Color: red, brown Foul Odor After Cleansing: No Wound Bed Granulation Amount: Medium (34-66%) Exposed Structure Granulation Quality: Pink, Pale Fascia Exposed: No Necrotic Amount: Medium (34-66%) Fat Layer Exposed: No Necrotic Quality: Adherent Slough Tendon Exposed: No Hou, Vesper R. ([id number]) Muscle Exposed: No Joint Exposed: No Bone Exposed: No Limited to Skin Breakdown Periwound Skin Texture Texture Color No Abnormalities Noted: No No Abnormalities Noted: No Callus: No Atrophie Blanche: No Crepitus: No Cyanosis: No Excoriation: No Ecchymosis: No Fluctuance: No Erythema: No Friable: No Hemosiderin Staining: No Induration: No Mottled: No Localized Edema: No Pallor: No Rash: No Rubor: No Scarring: No Temperature / Pain Moisture Temperature: No Abnormality No Abnormalities Noted: No Dry / Scaly: No Maceration: No Moist: Yes Wound Preparation Ulcer Cleansing: Rinsed/Irrigated with Saline Topical Anesthetic Applied: Other:  lidocaine 4%, Treatment Notes Wound #1 (Right Gluteus) 1. Cleansed with: Clean wound with Normal Saline 3. Peri-wound Care: Skin Prep 4. Dressing Applied: Aquacel Ag 5. Secondary Dressing Applied Bordered Foam Dressing Electronic Signature(s) Signed: 09/16/2015 3:30:01 PM By: Elpidio EricAfful, Rita BSN, RN Entered By: Elpidio EricAfful, Rita on 09/16/2015 11:06:49 Wendt, Chrysta R. ([id number]) -------------------------------------------------------------------------------- Vitals Details Patient Name: Marisa SchleinWOOTEN, Laury R. Date of Service: 09/16/2015 10:45 AM Medical Record Number: [id number] Patient Account Number: [id number] Date of Birth/Sex: 1924/02/23 (82 y.o. Female) Treating RN: Afful, RN, BSN, [place] Sinkita Primary Care Physician: Jodi MourningWalker III, John Other Clinician: Referring Physician: Jodi MourningWalker III, John Treating Physician/Extender: Rudene ReBritto, Errol Weeks in Treatment: 2 Vital Signs Time Taken: 11:05 Temperature (F): 97.8 Height (in): 60 Pulse (bpm): 76 Weight (lbs): 195 Respiratory Rate (breaths/min): 18 Body Mass Index (BMI): 38.1 Blood Pressure (mmHg): 174/69 Reference Range: 80 - 120 mg / dl Electronic Signature(s) Signed: 09/16/2015 3:30:01 PM By: Elpidio EricAfful, Rita BSN, RN Entered By: Elpidio EricAfful, Rita on 09/16/2015 11:05:36

## 2015-09-16 NOTE — Progress Notes (Addendum)
YAIRE, KREHER ([id number]) Visit Report for 09/16/2015 Chief Complaint Document Details Patient Name: Marisa [name], Marisa [name] 09/16/2015 10:45 Date of Service: AM Medical Record [id number] Number: Patient Account Number: [id number] 1923-06-08 (82 y.o. Treating RN: Afful, RN, BSN, [place] Sink Date of Birth/Sex: Female) Other Clinician: Primary Care Physician: Kennieth Francois Referring Physician: Jodi Mourning Physician/Extender: Weeks in Treatment: 2 Information Obtained from: Patient Chief Complaint Patient presents to the wound care center for a consult due non healing wound to the right gluteal area which she's had for about 3 weeks. Electronic Signature(s) Signed: 09/16/2015 11:31:28 AM By: Evlyn Kanner MD, FACS Entered By: Evlyn Kanner on 09/16/2015 11:31:28 Jenkinson, Cheetara RMarland Kitchen ([id number]) -------------------------------------------------------------------------------- Debridement Details Patient Name: [name], [name] 09/16/2015 10:45 Date of Service: AM Medical Record [id number] Number: Patient Account Number: [id number] [birth year] (82 y.o. Treating RN: Clover Mealy, RN, BSN, [place] Sink Date of Birth/Sex: Female) Other Clinician: Primary Care Physician: Janalyn Rouse, Vianny Schraeder Referring Physician: Jodi Mourning Physician/Extender: Weeks in Treatment: 2 Debridement Performed for Wound #1 Right Gluteus Assessment: Performed By: Physician Evlyn Kanner, MD Debridement: Debridement Pre-procedure Yes Verification/Time Out Taken: Start Time: 11:20 Level: Skin/Subcutaneous Tissue Total Area Debrided (L x 1 (cm) x 0.7 (cm) = 0.7 (cm) W): Tissue and other Viable, Non-Viable, Fibrin/Slough, Subcutaneous material debrided: Instrument: Curette Bleeding: Minimum Hemostasis Achieved: Pressure End Time: 11:23 Procedural Pain: 0 Post Procedural Pain: 0 Response to Treatment: Procedure was tolerated well Post Debridement Measurements of Total  Wound Length: (cm) 1 Width: (cm) 0.7 Depth: (cm) 0.1 Volume: (cm) 0.055 Post Procedure Diagnosis Same as Pre-procedure Electronic Signature(s) Signed: 09/16/2015 11:31:22 AM By: Evlyn Kanner MD, FACS Signed: 09/16/2015 3:30:01 PM By: Elpidio Eric BSN, RN Entered By: Evlyn Kanner on 09/16/2015 11:31:22 Guia, Zinia R. ([id number]) -------------------------------------------------------------------------------- HPI Details Patient Name: [name], Marisa 09/16/2015 10:45 Date of Service: AM Medical Record [id number] Number: Patient Account Number: [id number] Jan 12, 1924 (82 y.o. Treating RN: Clover Mealy, RN, BSN, [place] Sink Date of Birth/Sex: Female) Other Clinician: Primary Care Physician: Kennieth Francois Referring Physician: Jodi Mourning Physician/Extender: Weeks in Treatment: 2 History of Present Illness Location: right gluteal area ulcer Quality: Patient reports experiencing a dull pain to affected area(s). Severity: Patient states wound are getting worse. Duration: Patient has had the wound for < 3 weeks prior to presenting for treatment Timing: Pain in wound is Intermittent (comes and goes Context: The wound would happen gradually Modifying Factors: Other treatment(s) tried include:Diflucan and some Lotrisone cream Associated Signs and Symptoms: Patient reports having difficulty sitting for long periods. HPI Description: 82 year old patient who was seen by the PA at the internal medicine practice [facility]. The patient has been referred to others with what looks like a fungal infection in the perineal area with some open wound which was referred for an opinion. The patient's past medical history significant for hiatal hernia, osteoarthritis, status post appendectomy, cholecystectomy, knee arthroscopy. The patient is not been a smoker. The patient was put on Diflucan 100 mg daily for 3 days and was asked to use Lotrisone cream twice daily. Moisture  barrier was also recommended and off loading was suggested. the ulcerated areas on the right gluteal region and started off as a bump and then opened out into an ulcer. Electronic Signature(s) Signed: 09/16/2015 11:31:33 AM By: Evlyn Kanner MD, FACS Entered By: Evlyn Kanner on 09/16/2015 11:31:33 Gordy Savers ([id number]) -------------------------------------------------------------------------------- Physical Exam Details Patient Name: Marisa [name] 09/16/2015 10:45 Date of Service: AM Medical Record [id number]  Number: Patient Account Number: [id number] 21-May-1923 (82 y.o. Treating RN: Clover Mealy, RN, BSN, [place] Sink Date of Birth/Sex: Female) Other Clinician: Primary Care Physician: Kennieth Francois Referring Physician: Jodi Mourning Physician/Extender: Weeks in Treatment: 2 Constitutional . Pulse regular. Respirations normal and unlabored. Afebrile. . Eyes Nonicteric. Reactive to light. Ears, Nose, Mouth, and Throat Lips, teeth, and gums WNL.Marland Kitchen Moist mucosa without lesions. Neck supple and nontender. No palpable supraclavicular or cervical adenopathy. Normal sized without goiter. Respiratory WNL. No retractions.. Cardiovascular Pedal Pulses WNL. No clubbing, cyanosis or edema. Lymphatic No adneopathy. No adenopathy. No adenopathy. Musculoskeletal Adexa without tenderness or enlargement.. Digits and nails w/o clubbing, cyanosis, infection, petechiae, ischemia, or inflammatory conditions.. Integumentary (Hair, Skin) No suspicious lesions. No crepitus or fluctuance. No peri-wound warmth or erythema. No masses.Marland Kitchen Psychiatric Judgement and insight Intact.. No evidence of depression, anxiety, or agitation.. Notes the wound continues to have some subcutaneous debris and using a #3 curet sharply removed as much as possible and brisk bleeding controlled with pressure Electronic Signature(s) Signed: 09/16/2015 11:32:06 AM By: Evlyn Kanner MD, FACS Entered  By: Evlyn Kanner on 09/16/2015 11:32:05 Gordy Savers ([id number]) -------------------------------------------------------------------------------- Physician Orders Details Patient Name: Marisa Officer R. 09/16/2015 10:45 Date of Service: AM Medical Record [id number] Number: Patient Account Number: [id number] 06/03/1923 (82 y.o. Treating RN: Clover Mealy, RN, BSN, [place] Sink Date of Birth/Sex: Female) Other Clinician: Primary Care Physician: Kennieth Francois Referring Physician: Jodi Mourning Physician/Extender: Tania Ade in Treatment: 2 Verbal / Phone Orders: Yes Clinician: Afful, RN, BSN, Rita Read Back and Verified: Yes Diagnosis Coding Wound Cleansing Wound #1 Right Gluteus o Clean wound with Normal Saline. Anesthetic Wound #1 Right Gluteus o Topical Lidocaine 4% cream applied to wound bed prior to debridement Primary Wound Dressing Wound #1 Right Gluteus o Aquacel Ag Secondary Dressing Wound #1 Right Gluteus o Boardered Foam Dressing Dressing Change Frequency Wound #1 Right Gluteus o Change Dressing Monday, Wednesday, Friday - Monday in wound care center Follow-up Appointments Wound #1 Right Gluteus o Return Appointment in 1 week. Off-Loading Wound #1 Right Gluteus o Turn and reposition every 2 hours Additional Orders / Instructions Wound #1 Right Gluteus o Increase protein intake. Peggye, Poon Janyra RMarland Kitchen ([id number]) Home Health Wound #1 Right Gluteus o Continue Home Health Visits o Home Health Nurse may visit PRN to address patientos wound care needs. o FACE TO FACE ENCOUNTER: MEDICARE and MEDICAID PATIENTS: I certify that this patient is under my care and that I had a face-to-face encounter that meets the physician face-to-face encounter requirements with this patient on this date. The encounter with the patient was in whole or in part for the following MEDICAL CONDITION: (primary reason for Home Healthcare) MEDICAL NECESSITY: I  certify, that based on my findings, NURSING services are a medically necessary home health service. HOME BOUND STATUS: I certify that my clinical findings support that this patient is homebound (i.e., Due to illness or injury, pt requires aid of supportive devices such as crutches, cane, wheelchairs, walkers, the use of special transportation or the assistance of another person to leave their place of residence. There is a normal inability to leave the home and doing so requires considerable and taxing effort. Other absences are for medical reasons / religious services and are infrequent or of short duration when for other reasons). o If current dressing causes regression in wound condition, may D/C ordered dressing product/s and apply Normal Saline Moist Dressing daily until next Wound Healing Center / Other MD appointment. Notify  Wound Healing Center of regression in wound condition at [phone]. Electronic Signature(s) Signed: 09/16/2015 3:30:01 PM By: Elpidio EricAfful, Rita BSN, RN Signed: 09/16/2015 4:13:37 PM By: Evlyn KannerBritto, Jahna Liebert MD, FACS Entered By: Elpidio EricAfful, Rita on 09/16/2015 11:25:01 Heacock, Waleska R. ([id number]) -------------------------------------------------------------------------------- Problem List Details Patient Name: Gordy SaversWOOTEN, Porscha R. 09/16/2015 10:45 Date of Service: AM Medical Record [id number] Number: Patient Account Number: [id number] [birth year] (82 y.o. Treating RN: Clover MealyAfful, RN, BSN, [place] Date of Birth/Sex: Female) Other Clinician: Primary Care Physician: Kennieth FrancoisWalker III, John Treating Mry Lamia Referring Physician: Jodi MourningWalker III, John Physician/Extender: Tania AdeWeeks in Treatment: 2 Active Problems ICD-10 Encounter Code Description Active Date Diagnosis S31.819A Unspecified open wound of right buttock, initial encounter 09/02/2015 Yes L02.32 Furuncle of buttock 09/02/2015 Yes Inactive Problems Resolved Problems Electronic Signature(s) Signed: 09/16/2015 11:31:07 AM By: Evlyn KannerBritto, Onyx Edgley  MD, FACS Entered By: Evlyn KannerBritto, Shia Eber on 09/16/2015 11:31:07 Philyaw, Barba R. ([id number]) -------------------------------------------------------------------------------- Progress Note Details Patient Name: Gordy SaversWOOTEN, Melani R. 09/16/2015 10:45 Date of Service: AM Medical Record [id number] Number: Patient Account Number: [id number] [birth year] (82 y.o. Treating RN: Afful, RN, BSN,  [place] Date of Birth/Sex: Female) Other Clinician: Primary Care Physician: Kennieth FrancoisWalker III, John Treating Loucinda Croy Referring Physician: Jodi MourningWalker III, John Physician/Extender: Weeks in Treatment: 2 Subjective Chief Complaint Information obtained from Patient Patient presents to the wound care center for a consult due non healing wound to the right gluteal area which she's had for about 3 weeks. History of Present Illness (HPI) The following HPI elements were documented for the patient's wound: Location: right gluteal area ulcer Quality: Patient reports experiencing a dull pain to affected area(s). Severity: Patient states wound are getting worse. Duration: Patient has had the wound for < 3 weeks prior to presenting for treatment Timing: Pain in wound is Intermittent (comes and goes Context: The wound would happen gradually Modifying Factors: Other treatment(s) tried include:Diflucan and some Lotrisone cream Associated Signs and Symptoms: Patient reports having difficulty sitting for long periods. 82 year old patient who was seen by the PA at [facility]. The patient has been referred to others with what looks like a fungal infection in the perineal area with some open wound which was referred for an opinion. The patient's past medical history significant for hiatal hernia, osteoarthritis, status post appendectomy, cholecystectomy, knee arthroscopy. The patient is not been a smoker. The patient was put on Diflucan 100 mg daily for 3 days and was asked to use Lotrisone cream twice  daily. Moisture barrier was also recommended and off loading was suggested. the ulcerated areas on the right gluteal region and started off as a bump and then opened out into an ulcer. Objective Constitutional Pulse regular. Respirations normal and unlabored. Afebrile. Vitals Time Taken: 11:05 AM, Height: 60 in, Weight: 195 lbs, BMI: 38.1, Temperature: 97.8 F, Pulse: 76 Simms, Thomas R. ([id number]) bpm, Respiratory Rate: 18 breaths/min, Blood Pressure: 174/69 mmHg. Eyes Nonicteric. Reactive to light. Ears, Nose, Mouth, and Throat Lips, teeth, and gums WNL.Marland Kitchen. Moist mucosa without lesions. Neck supple and nontender. No palpable supraclavicular or cervical adenopathy. Normal sized without goiter. Respiratory WNL. No retractions.. Cardiovascular Pedal Pulses WNL. No clubbing, cyanosis or edema. Lymphatic No adneopathy. No adenopathy. No adenopathy. Musculoskeletal Adexa without tenderness or enlargement.. Digits and nails w/o clubbing, cyanosis, infection, petechiae, ischemia, or inflammatory conditions.Marland Kitchen. Psychiatric Judgement and insight Intact.. No evidence of depression, anxiety, or agitation.. General Notes: the wound continues to have some subcutaneous debris and using a #3 curet sharply removed as much as possible and brisk bleeding controlled with pressure Integumentary (  Hair, Skin) No suspicious lesions. No crepitus or fluctuance. No peri-wound warmth or erythema. No masses.. Wound #1 status is Open. Original cause of wound was Bump. The wound is located on the Right Gluteus. The wound measures 1cm length x 0.7cm width x 0.1cm depth; 0.55cm^2 area and 0.055cm^3 volume. The wound is limited to skin breakdown. There is no tunneling or undermining noted. There is a medium amount of serosanguineous drainage noted. The wound margin is distinct with the outline attached to the wound base. There is medium (34-66%) pink, pale granulation within the wound bed. There is a  medium (34-66%) amount of necrotic tissue within the wound bed including Adherent Slough. The periwound skin appearance exhibited: Moist. The periwound skin appearance did not exhibit: Callus, Crepitus, Excoriation, Fluctuance, Friable, Induration, Localized Edema, Rash, Scarring, Dry/Scaly, Maceration, Atrophie Blanche, Cyanosis, Ecchymosis, Hemosiderin Staining, Mottled, Pallor, Rubor, Erythema. Periwound temperature was noted as No Abnormality. Marcantel, Presley R. ([id number]) Assessment Active Problems ICD-10 S31.819A - Unspecified open wound of right buttock, initial encounter L02.32 - Furuncle of buttock Procedures Wound #1 Wound #1 is an Abscess located on the Right Gluteus . There was a Skin/Subcutaneous Tissue Debridement ([id number]) debridement with total area of 0.7 sq cm performed by Evlyn KannerBritto, Shandricka Monroy, MD. with the following instrument(s): Curette to remove Viable and Non-Viable tissue/material including Fibrin/Slough and Subcutaneous. A time out was conducted prior to the start of the procedure. A Minimum amount of bleeding was controlled with Pressure. The procedure was tolerated well with a pain level of 0 throughout and a pain level of 0 following the procedure. Post Debridement Measurements: 1cm length x 0.7cm width x 0.1cm depth; 0.055cm^3 volume. Post procedure Diagnosis Wound #1: Same as Pre-Procedure Plan Wound Cleansing: Wound #1 Right Gluteus: Clean wound with Normal Saline. Anesthetic: Wound #1 Right Gluteus: Topical Lidocaine 4% cream applied to wound bed prior to debridement Primary Wound Dressing: Wound #1 Right Gluteus: Aquacel Ag Secondary Dressing: Wound #1 Right Gluteus: Boardered Foam Dressing Dressing Change Frequency: Wound #1 Right Gluteus: Change Dressing Monday, Wednesday, Friday - Monday in wound care center Follow-up Appointments: Wound #1 Right Gluteus: [place], Darthula R. ([id number]) Return Appointment in 1 week. Off-Loading: Wound #1 Right  Gluteus: Turn and reposition every 2 hours Additional Orders / Instructions: Wound #1 Right Gluteus: Increase protein intake. Home Health: Wound #1 Right Gluteus: Continue Home Health Visits Home Health Nurse may visit PRN to address patient s wound care needs. FACE TO FACE ENCOUNTER: MEDICARE and MEDICAID PATIENTS: I certify that this patient is under my care and that I had a face-to-face encounter that meets the physician face-to-face encounter requirements with this patient on this date. The encounter with the patient was in whole or in part for the following MEDICAL CONDITION: (primary reason for Home Healthcare) MEDICAL NECESSITY: I certify, that based on my findings, NURSING services are a medically necessary home health service. HOME BOUND STATUS: I certify that my clinical findings support that this patient is homebound (i.e., Due to illness or injury, pt requires aid of supportive devices such as crutches, cane, wheelchairs, walkers, the use of special transportation or the assistance of another person to leave their place of residence. There is a normal inability to leave the home and doing so requires considerable and taxing effort. Other absences are for medical reasons / religious services and are infrequent or of short duration when for other reasons). If current dressing causes regression in wound condition, may D/C ordered dressing product/s and apply Normal Saline Moist Dressing daily  until next Wound Healing Center / Other MD appointment. Notify Wound Healing Center of regression in wound condition at [phone]. This stage I have recommended: 1. Silver alginate and a bordered foam to be changed as required daily or every other day. 2. Offloading as much as possible -- she says she's been compliant with this 3. See me back in a week's time for follow-up. Electronic Signature(s) Signed: 09/16/2015 11:32:49 AM By: Evlyn Kanner MD, FACS Entered By: Evlyn Kanner on  09/16/2015 11:32:48 Sheriff, Ritamarie R. ([id number]) -------------------------------------------------------------------------------- SuperBill Details Patient Name: [name], Emilly R. Date of Service: 09/16/2015 Medical Record Patient Account Number: [id number] Number: Afful, RN, BSN, Treating RN: 08/31/1923 (82 y.o.  Sink Date of Birth/Sex: Female) Other Clinician: Primary Care Physician: Kennieth Francois Referring Physician: Jodi Mourning Physician/Extender: Tania Ade in Treatment: 2 Diagnosis Coding ICD-10 Codes Code Description [phone] Unspecified open wound of right buttock, initial encounter L02.32 Furuncle of buttock Facility Procedures CPT4 Code: [id number] Description: 11042 - DEB SUBQ TISSUE 20 SQ CM/< ICD-10 Description Diagnosis S31.819A Unspecified open wound of right buttock, initial L02.32 Furuncle of buttock Modifier: encounter Quantity: 1 Physician Procedures CPT4 Code: [id number] Description: 11042 - WC PHYS SUBQ TISS 20 SQ CM ICD-10 Description Diagnosis S31.819A Unspecified open wound of right buttock, initial L02.32 Furuncle of buttock Modifier: encounter Quantity: 1 Electronic Signature(s) Signed: 09/16/2015 11:33:00 AM By: Evlyn Kanner MD, FACS Entered By: Evlyn Kanner on 09/16/2015 11:32:59

## 2015-09-23 ENCOUNTER — Encounter: Payer: Medicare Other | Attending: Surgery | Admitting: Surgery

## 2015-09-23 DIAGNOSIS — I110 Hypertensive heart disease with heart failure: Secondary | ICD-10-CM | POA: Insufficient documentation

## 2015-09-23 DIAGNOSIS — J449 Chronic obstructive pulmonary disease, unspecified: Secondary | ICD-10-CM | POA: Insufficient documentation

## 2015-09-23 DIAGNOSIS — L0232 Furuncle of buttock: Secondary | ICD-10-CM | POA: Diagnosis not present

## 2015-09-23 DIAGNOSIS — I509 Heart failure, unspecified: Secondary | ICD-10-CM | POA: Insufficient documentation

## 2015-09-23 DIAGNOSIS — X58XXXA Exposure to other specified factors, initial encounter: Secondary | ICD-10-CM | POA: Insufficient documentation

## 2015-09-23 DIAGNOSIS — S31819A Unspecified open wound of right buttock, initial encounter: Secondary | ICD-10-CM | POA: Insufficient documentation

## 2015-09-23 NOTE — Progress Notes (Signed)
Marisa, [name] ([id number]) Visit Report for 09/23/2015 Chief Complaint Document Details Patient Name: Marisa [name], Marisa [name]. Date of Service: 09/23/2015 1:30 PM Medical Record Patient Account Number: [id number] Number: Afful, RN, BSN, Treating RN: March 17, 1924 (82 y.o. [place] Sink Date of Birth/Sex: Female) Other Clinician: Primary Care Physician: Kennieth Francois Referring Physician: Jodi Mourning Physician/Extender: Tania Ade in Treatment: 3 Information Obtained from: Patient Chief Complaint Patient presents to the wound care center for a consult due non healing wound to the right gluteal area which she's had for about 3 weeks. Electronic Signature(s) Signed: 09/23/2015 2:07:36 PM By: Evlyn Kanner MD, FACS Entered By: Evlyn Kanner on 09/23/2015 14:07:36 Blume, Mirayah [name]. ([id number]) -------------------------------------------------------------------------------- Debridement Details Patient Name: Marisa [name], Marisa [name]. Date of Service: 09/23/2015 1:30 PM Medical Record Patient Account Number: [id number] Number: Afful, RN, BSN, Treating RN: 1923/06/09 (82 y.o. [place] Sink Date of Birth/Sex: Female) Other Clinician: Primary Care Physician: Kennieth Francois Referring Physician: Jodi Mourning Physician/Extender: Tania Ade in Treatment: 3 Debridement Performed for Wound #1 Right Gluteus Assessment: Performed By: Physician Evlyn Kanner, MD Debridement: Debridement Pre-procedure Yes Verification/Time Out Taken: Start Time: 02:05 Pain Control: Lidocaine 4% Topical Solution Level: Skin/Subcutaneous Tissue Total Area Debrided (L x 1 (cm) x 0.5 (cm) = 0.5 (cm) W): Tissue and other Viable, Non-Viable, Fibrin/Slough, Subcutaneous material debrided: Instrument: Curette Bleeding: Minimum Hemostasis Achieved: Pressure End Time: 02:07 Procedural Pain: 0 Post Procedural Pain: 0 Response to Treatment: Procedure was tolerated well Post  Debridement Measurements of Total Wound Length: (cm) 1 Width: (cm) 0.5 Depth: (cm) 0.2 Volume: (cm) 0.079 Post Procedure Diagnosis Same as Pre-procedure Electronic Signature(s) Signed: 09/23/2015 2:07:30 PM By: Evlyn Kanner MD, FACS Signed: 09/23/2015 3:39:38 PM By: Elpidio Eric BSN, RN Entered By: Evlyn Kanner on 09/23/2015 14:07:30 Accardo, Denisha [name]. ([id number]) -------------------------------------------------------------------------------- HPI Details Patient Name: [name], Marisa [name]. Date of Service: 09/23/2015 1:30 PM Medical Record Patient Account Number: [id number] Number: Afful, RN, BSN, Treating RN: [birth year] (82 y.o. [place] Sink Date of Birth/Sex: Female) Other Clinician: Primary Care Physician: Kennieth Francois Referring Physician: Jodi Mourning Physician/Extender: Tania Ade in Treatment: 3 History of Present Illness Location: right gluteal area ulcer Quality: Patient reports experiencing a dull pain to affected area(s). Severity: Patient states wound are getting worse. Duration: Patient has had the wound for < 3 weeks prior to presenting for treatment Timing: Pain in wound is Intermittent (comes and goes Context: The wound would happen gradually Modifying Factors: Other treatment(s) tried include:Diflucan and some Lotrisone cream Associated Signs and Symptoms: Patient reports having difficulty sitting for long periods. HPI Description: 82 year old patient who was seen by the PA at the internal medicine practice [facility]. The patient has been referred to others with what looks like a fungal infection in the perineal area with some open wound which was referred for an opinion. The patient's past medical history significant for hiatal hernia, osteoarthritis, status post appendectomy, cholecystectomy, knee arthroscopy. The patient is not been a smoker. The patient was put on Diflucan 100 mg daily for 3 days and was asked to use Lotrisone  cream twice daily. Moisture barrier was also recommended and off loading was suggested. the ulcerated areas on the right gluteal region and started off as a bump and then opened out into an ulcer. Electronic Signature(s) Signed: 09/23/2015 2:07:49 PM By: Evlyn Kanner MD, FACS Entered By: Evlyn Kanner on 09/23/2015 14:07:49 Bernick, Larae [name]. ([id number]) -------------------------------------------------------------------------------- Physical Exam Details Patient Name: [name], Marisa [name]. Date of Service:  09/23/2015 1:30 PM Medical Record Patient Account Number: [id number] Number: Afful, RN, BSN, Treating RN: October 30, 1923 (82 y.o. [place] Sink Date of Birth/Sex: Female) Other Clinician: Primary Care Physician: Kennieth Francois Referring Physician: Jodi Mourning Physician/Extender: Tania Ade in Treatment: 3 Constitutional . Pulse regular. Respirations normal and unlabored. Afebrile. . Eyes Nonicteric. Reactive to light. Ears, Nose, Mouth, and Throat Lips, teeth, and gums WNL.Marland Kitchen Moist mucosa without lesions. Neck supple and nontender. No palpable supraclavicular or cervical adenopathy. Normal sized without goiter. Respiratory WNL. No retractions.. Breath sounds WNL, No rubs, rales, rhonchi, or wheeze.. Cardiovascular Heart rhythm and rate regular, no murmur or gallop.. Pedal Pulses WNL. No clubbing, cyanosis or edema. Lymphatic No adneopathy. No adenopathy. No adenopathy. Musculoskeletal Adexa without tenderness or enlargement.. Digits and nails w/o clubbing, cyanosis, infection, petechiae, ischemia, or inflammatory conditions.. Integumentary (Hair, Skin) No suspicious lesions. No crepitus or fluctuance. No peri-wound warmth or erythema. No masses.Marland Kitchen Psychiatric Judgement and insight Intact.. No evidence of depression, anxiety, or agitation.. Notes the wound is smaller in size and there is still some subcutaneous debris which had sharply removed with a #3 curet  and the brisk bleeding was controlled with pressure and then silver alginate has been applied to this area. Electronic Signature(s) Signed: 09/23/2015 2:09:06 PM By: Evlyn Kanner MD, FACS Entered By: Evlyn Kanner on 09/23/2015 14:09:04 Armbrust, Pasty [name]. ([id number]) -------------------------------------------------------------------------------- Physician Orders Details Patient Name: Marisa [name], Yetunde [name]. Date of Service: 09/23/2015 1:30 PM Medical Record Patient Account Number: [id number] Number: Afful, RN, BSN, Treating RN: 08/02/1923 (82 y.o. [place] Sink Date of Birth/Sex: Female) Other Clinician: Primary Care Physician: Kennieth Francois Referring Physician: Jodi Mourning Physician/Extender: Tania Ade in Treatment: 3 Verbal / Phone Orders: Yes Clinician: Afful, RN, BSN, Rita Read Back and Verified: Yes Diagnosis Coding Wound Cleansing Wound #1 Right Gluteus o Clean wound with Normal Saline. Anesthetic Wound #1 Right Gluteus o Topical Lidocaine 4% cream applied to wound bed prior to debridement Primary Wound Dressing Wound #1 Right Gluteus o Aquacel Ag Secondary Dressing Wound #1 Right Gluteus o Boardered Foam Dressing Dressing Change Frequency Wound #1 Right Gluteus o Change Dressing Monday, Wednesday, Friday - Monday in wound care center Follow-up Appointments Wound #1 Right Gluteus o Return Appointment in 1 week. Off-Loading Wound #1 Right Gluteus o Turn and reposition every 2 hours Additional Orders / Instructions Wound #1 Right Gluteus o Increase protein intake. o Other: - MVI, ZINC, Vitamin C Freeze, Daiana [name]. ([id number]) Home Health Wound #1 Right Gluteus o Continue Home Health Visits o Home Health Nurse may visit PRN to address patientos wound care needs. o FACE TO FACE ENCOUNTER: MEDICARE and MEDICAID PATIENTS: I certify that this patient is under my care and that I had a face-to-face encounter that meets the  physician face-to-face encounter requirements with this patient on this date. The encounter with the patient was in whole or in part for the following MEDICAL CONDITION: (primary reason for Home Healthcare) MEDICAL NECESSITY: I certify, that based on my findings, NURSING services are a medically necessary home health service. HOME BOUND STATUS: I certify that my clinical findings support that this patient is homebound (i.e., Due to illness or injury, pt requires aid of supportive devices such as crutches, cane, wheelchairs, walkers, the use of special transportation or the assistance of another person to leave their place of residence. There is a normal inability to leave the home and doing so requires considerable and taxing effort. Other absences are for medical reasons /  religious services and are infrequent or of short duration when for other reasons). o If current dressing causes regression in wound condition, may D/C ordered dressing product/s and apply Normal Saline Moist Dressing daily until next Wound Healing Center / Other MD appointment. Notify Wound Healing Center of regression in wound condition at [phone]. Electronic Signature(s) Signed: 09/23/2015 3:39:38 PM By: Elpidio Eric BSN, RN Signed: 09/23/2015 4:11:02 PM By: Evlyn Kanner MD, FACS Entered By: Elpidio Eric on 09/23/2015 14:05:58 Paschal, Rosaisela [name]. ([id number]) -------------------------------------------------------------------------------- Problem List Details Patient Name: Eckford, Kynlie [name]. Date of Service: 09/23/2015 1:30 PM Medical Record Patient Account Number: [id number] Number: Afful, RN, BSN, Treating RN: 1924-03-21 (82 y.o. [place] Sink Date of Birth/Sex: Female) Other Clinician: Primary Care Physician: Kennieth Francois Referring Physician: Jodi Mourning Physician/Extender: Tania Ade in Treatment: 3 Active Problems ICD-10 Encounter Code Description Active Date Diagnosis S31.819A  Unspecified open wound of right buttock, initial encounter 09/02/2015 Yes L02.32 Furuncle of buttock 09/02/2015 Yes Inactive Problems Resolved Problems Electronic Signature(s) Signed: 09/23/2015 2:06:23 PM By: Evlyn Kanner MD, FACS Entered By: Evlyn Kanner on 09/23/2015 14:06:23 Dise, Kazi [name]. ([id number]) -------------------------------------------------------------------------------- Progress Note Details Patient Name: Balch, Arlana [name]. Date of Service: 09/23/2015 1:30 PM Medical Record Patient Account Number: [id number] Number: Afful, RN, BSN, Treating RN: [birth year] (82 y.o. [place] Sink Date of Birth/Sex: Female) Other Clinician: Primary Care Physician: Kennieth Francois Referring Physician: Jodi Mourning Physician/Extender: Tania Ade in Treatment: 3 Subjective Chief Complaint Information obtained from Patient Patient presents to the wound care center for a consult due non healing wound to the right gluteal area which she's had for about 3 weeks. History of Present Illness (HPI) The following HPI elements were documented for the patient's wound: Location: right gluteal area ulcer Quality: Patient reports experiencing a dull pain to affected area(s). Severity: Patient states wound are getting worse. Duration: Patient has had the wound for < 3 weeks prior to presenting for treatment Timing: Pain in wound is Intermittent (comes and goes Context: The wound would happen gradually Modifying Factors: Other treatment(s) tried include:Diflucan and some Lotrisone cream Associated Signs and Symptoms: Patient reports having difficulty sitting for long periods. 82 year old patient who was seen by the PA at the internal medicine practice [facility]. The patient has been referred to others with what looks like a fungal infection in the perineal area with some open wound which was referred for an opinion. The patient's past medical history significant for hiatal  hernia, osteoarthritis, status post appendectomy, cholecystectomy, knee arthroscopy. The patient is not been a smoker. The patient was put on Diflucan 100 mg daily for 3 days and was asked to use Lotrisone cream twice daily. Moisture barrier was also recommended and off loading was suggested. the ulcerated areas on the right gluteal region and started off as a bump and then opened out into an ulcer. Objective Constitutional Pulse regular. Respirations normal and unlabored. Afebrile. Vitals Time Taken: 1:58 PM, Height: 60 in, Weight: 195 lbs, BMI: 38.1, Temperature: 97.6 F, Pulse: 78 Peed, Oaklee [name]. ([id number]) bpm, Respiratory Rate: 17 breaths/min, Blood Pressure: 148/54 mmHg. Eyes Nonicteric. Reactive to light. Ears, Nose, Mouth, and Throat Lips, teeth, and gums WNL.Marland Kitchen Moist mucosa without lesions. Neck supple and nontender. No palpable supraclavicular or cervical adenopathy. Normal sized without goiter. Respiratory WNL. No retractions.. Breath sounds WNL, No rubs, rales, rhonchi, or wheeze.. Cardiovascular Heart rhythm and rate regular, no murmur or gallop.. Pedal Pulses WNL. No clubbing, cyanosis or edema. Lymphatic No adneopathy.  No adenopathy. No adenopathy. Musculoskeletal Adexa without tenderness or enlargement.. Digits and nails w/o clubbing, cyanosis, infection, petechiae, ischemia, or inflammatory conditions.Marland Kitchen. Psychiatric Judgement and insight Intact.. No evidence of depression, anxiety, or agitation.. General Notes: the wound is smaller in size and there is still some subcutaneous debris which had sharply removed with a #3 curet and the brisk bleeding was controlled with pressure and then silver alginate has been applied to this area. Integumentary (Hair, Skin) No suspicious lesions. No crepitus or fluctuance. No peri-wound warmth or erythema. No masses.. Wound #1 status is Open. Original cause of wound was Bump. The wound is located on the Right Gluteus. The wound  measures 1cm length x 0.5cm width x 0.1cm depth; 0.393cm^2 area and 0.039cm^3 volume. The wound is limited to skin breakdown. There is no tunneling or undermining noted. There is a medium amount of serosanguineous drainage noted. The wound margin is distinct with the outline attached to the wound base. There is medium (34-66%) pink, pale granulation within the wound bed. There is a medium (34-66%) amount of necrotic tissue within the wound bed including Adherent Slough. The periwound skin appearance exhibited: Moist. The periwound skin appearance did not exhibit: Callus, Crepitus, Excoriation, Fluctuance, Friable, Induration, Localized Edema, Rash, Scarring, Dry/Scaly, Maceration, Atrophie Blanche, Cyanosis, Ecchymosis, Hemosiderin Staining, Mottled, Pallor, Rubor, Erythema. Periwound temperature was noted as No Abnormality. Assessment Barabas, Markie [name]. ([id number]) Active Problems ICD-10 S31.819A - Unspecified open wound of right buttock, initial encounter L02.32 - Furuncle of buttock Procedures Wound #1 Wound #1 is an Abscess located on the Right Gluteus . There was a Skin/Subcutaneous Tissue Debridement ([id number]) debridement with total area of 0.5 sq cm performed by Evlyn KannerBritto, Dayzee Trower, MD. with the following instrument(s): Curette to remove Viable and Non-Viable tissue/material including Fibrin/Slough and Subcutaneous after achieving pain control using Lidocaine 4% Topical Solution. A time out was conducted prior to the start of the procedure. A Minimum amount of bleeding was controlled with Pressure. The procedure was tolerated well with a pain level of 0 throughout and a pain level of 0 following the procedure. Post Debridement Measurements: 1cm length x 0.5cm width x 0.2cm depth; 0.079cm^3 volume. Post procedure Diagnosis Wound #1: Same as Pre-Procedure Plan Wound Cleansing: Wound #1 Right Gluteus: Clean wound with Normal Saline. Anesthetic: Wound #1 Right Gluteus: Topical Lidocaine  4% cream applied to wound bed prior to debridement Primary Wound Dressing: Wound #1 Right Gluteus: Aquacel Ag Secondary Dressing: Wound #1 Right Gluteus: Boardered Foam Dressing Dressing Change Frequency: Wound #1 Right Gluteus: Change Dressing Monday, Wednesday, Friday - Monday in wound care center Follow-up Appointments: Wound #1 Right Gluteus: Return Appointment in 1 week. Off-Loading: [place], Cherril [name]. ([id number]) Wound #1 Right Gluteus: Turn and reposition every 2 hours Additional Orders / Instructions: Wound #1 Right Gluteus: Increase protein intake. Other: - MVI, ZINC, Vitamin C Home Health: Wound #1 Right Gluteus: Continue Home Health Visits Home Health Nurse may visit PRN to address patient s wound care needs. FACE TO FACE ENCOUNTER: MEDICARE and MEDICAID PATIENTS: I certify that this patient is under my care and that I had a face-to-face encounter that meets the physician face-to-face encounter requirements with this patient on this date. The encounter with the patient was in whole or in part for the following MEDICAL CONDITION: (primary reason for Home Healthcare) MEDICAL NECESSITY: I certify, that based on my findings, NURSING services are a medically necessary home health service. HOME BOUND STATUS: I certify that my clinical findings support that this patient is homebound (i.e.,  Due to illness or injury, pt requires aid of supportive devices such as crutches, cane, wheelchairs, walkers, the use of special transportation or the assistance of another person to leave their place of residence. There is a normal inability to leave the home and doing so requires considerable and taxing effort. Other absences are for medical reasons / religious services and are infrequent or of short duration when for other reasons). If current dressing causes regression in wound condition, may D/C ordered dressing product/s and apply Normal Saline Moist Dressing daily until next Wound  Healing Center / Other MD appointment. Notify Wound Healing Center of regression in wound condition at [phone]. This stage I have recommended: 1. Silver alginate and a bordered foam to be changed as required daily or every other day. 2. Offloading as much as possible -- she says she's been compliant with this. 3. her daughter was at the bedside and I have discussed increased protein, vitamin A, vitamin C and zinc. 4. See me back in a week's time for follow-up. Electronic Signature(s) Signed: 09/23/2015 2:10:20 PM By: Evlyn KannerBritto, Laury Huizar MD, FACS Entered By: Evlyn KannerBritto, Aliveah Gallant on 09/23/2015 14:10:20 Dalsanto, Shaniece [name]. ([id number]) -------------------------------------------------------------------------------- SuperBill Details Patient Name: Marisa SchleinWOOTEN, Ceciley [name]. Date of Service: 09/23/2015 Medical Record Patient Account Number: [id number] Number: Afful, RN, BSN, Treating RN: 01-May-1923 (82 y.o. [place] Date of Birth/Sex: Female) Other Clinician: Primary Care Physician: Kennieth FrancoisWalker III, John Treating Oliva Montecalvo Referring Physician: Jodi MourningWalker III, John Physician/Extender: Tania AdeWeeks in Treatment: 3 Diagnosis Coding ICD-10 Codes Code Description [phone] Unspecified open wound of right buttock, initial encounter L02.32 Furuncle of buttock Facility Procedures CPT4 Code: [id number] Description: 11042 - DEB SUBQ TISSUE 20 SQ CM/< ICD-10 Description Diagnosis S31.819A Unspecified open wound of right buttock, initial L02.32 Furuncle of buttock Modifier: encounter Quantity: 1 Physician Procedures CPT4 Code: [id number] Description: 11042 - WC PHYS SUBQ TISS 20 SQ CM ICD-10 Description Diagnosis S31.819A Unspecified open wound of right buttock, initial L02.32 Furuncle of buttock Modifier: encounter Quantity: 1 Electronic Signature(s) Signed: 09/23/2015 2:10:27 PM By: Evlyn KannerBritto, Leonell Lobdell MD, FACS Entered By: Evlyn KannerBritto, Keidy Thurgood on 09/23/2015 14:10:27

## 2015-09-23 NOTE — Progress Notes (Signed)
Gordy SaversWOOTEN, Natash R. ([id number]) Visit Report for 09/23/2015 Arrival Information Details Patient Name: Gordy SaversWOOTEN, Domini R. Date of Service: 09/23/2015 1:30 PM Medical Record Number: [id number] Patient Account Number: [id number] Date of Birth/Sex: July 13, 1923 (82 y.o. Female) Treating RN: Afful, RN, BSN, [place] Sinkita Primary Care Physician: Jodi MourningWalker III, John Other Clinician: Referring Physician: Jodi MourningWalker III, John Treating Physician/Extender: Rudene ReBritto, Errol Weeks in Treatment: 3 Visit Information History Since Last Visit Added or deleted any medications: No Patient Arrived: Walker Any new allergies or adverse reactions: No Arrival Time: 13:56 Had a fall or experienced change in No Accompanied By: dtr activities of daily living that may affect Transfer Assistance: None risk of falls: Patient Identification Verified: Yes Has Dressing in Place as Prescribed: Yes Secondary Verification Process Yes Pain Present Now: No Completed: Patient Requires Transmission- No Based Precautions: Patient Has Alerts: Yes Patient Alerts: Patient on Blood Thinner Aspirin Electronic Signature(s) Signed: 09/23/2015 3:39:38 PM By: Elpidio EricAfful, Rita BSN, RN Entered By: Elpidio EricAfful, Rita on 09/23/2015 13:57:14 Crespin, Shawanna R. ([id number]) -------------------------------------------------------------------------------- Encounter Discharge Information Details Patient Name: Sunny SchleinWOOTEN, Amena R. Date of Service: 09/23/2015 1:30 PM Medical Record Number: [id number] Patient Account Number: [id number] Date of Birth/Sex: July 13, 1923 (82 y.o. Female) Treating RN: Clover MealyAfful, RN, BSN, [place] Sinkita Primary Care Physician: Jodi MourningWalker III, John Other Clinician: Referring Physician: Jodi MourningWalker III, John Treating Physician/Extender: Rudene ReBritto, Errol Weeks in Treatment: 3 Encounter Discharge Information Items Discharge Pain Level: 0 Discharge Condition: Stable Ambulatory Status: Walker Discharge Destination: Home Transportation: Private Auto Accompanied By: dtr Schedule  Follow-up Appointment: No Medication Reconciliation completed No and provided to Patient/Care Dennie Vecchio: Provided on Clinical Summary of Care: 09/23/2015 Form Type Recipient Paper Patient GW Electronic Signature(s) Signed: 09/23/2015 3:39:38 PM By: Elpidio EricAfful, Rita BSN, RN Previous Signature: 09/23/2015 2:06:19 PM Version By: Gwenlyn PerkingMoore, Shelia Entered By: Elpidio EricAfful, Rita on 09/23/2015 14:07:49 Escareno, Glinda R. ([id number]) -------------------------------------------------------------------------------- Multi Wound Chart Details Patient Name: Lebeau, Ameila R. Date of Service: 09/23/2015 1:30 PM Medical Record Number: [id number] Patient Account Number: [id number] Date of Birth/Sex: July 13, 1923 (82 y.o. Female) Treating RN: Clover MealyAfful, RN, BSN,  Sinkita Primary Care Physician: Jodi MourningWalker III, John Other Clinician: Referring Physician: Jodi MourningWalker III, John Treating Physician/Extender: Rudene ReBritto, Errol Weeks in Treatment: 3 Vital Signs Height(in): 60 Pulse(bpm): 78 Weight(lbs): 195 Blood Pressure 148/54 (mmHg): Body Mass Index(BMI): 38 Temperature(F): 97.6 Respiratory Rate 17 (breaths/min): Photos: [1:No Photos] [N/A:N/A] Wound Location: [1:Right Gluteus] [N/A:N/A] Wounding Event: [1:Bump] [N/A:N/A] Primary Etiology: [1:Abscess] [N/A:N/A] Comorbid History: [1:Chronic Obstructive Pulmonary Disease (COPD), Congestive Heart Failure, Hypertension, Osteoarthritis] [N/A:N/A] Date Acquired: [1:08/26/2015] [N/A:N/A] Weeks of Treatment: [1:3] [N/A:N/A] Wound Status: [1:Open] [N/A:N/A] Measurements L x W x D 1x0.5x0.1 [N/A:N/A] (cm) Area (cm) : [1:0.393] [N/A:N/A] Volume (cm) : [1:0.039] [N/A:N/A] % Reduction in Area: [1:16.60%] [N/A:N/A] % Reduction in Volume: 58.50% [N/A:N/A] Classification: [1:Partial Thickness] [N/A:N/A] Exudate Amount: [1:Medium] [N/A:N/A] Exudate Type: [1:Serosanguineous] [N/A:N/A] Exudate Color: [1:red, brown] [N/A:N/A] Wound Margin: [1:Distinct, outline attached] [N/A:N/A] Granulation Amount:  [1:Medium (34-66%)] [N/A:N/A] Granulation Quality: [1:Pink, Pale] [N/A:N/A] Necrotic Amount: [1:Medium (34-66%)] [N/A:N/A] Exposed Structures: [1:Fascia: No Fat: No Tendon: No] [N/A:N/A] Muscle: No Joint: No Bone: No Limited to Skin Breakdown Epithelialization: Small (1-33%) N/A N/A Periwound Skin Texture: Edema: No N/A N/A Excoriation: No Induration: No Callus: No Crepitus: No Fluctuance: No Friable: No Rash: No Scarring: No Periwound Skin Moist: Yes N/A N/A Moisture: Maceration: No Dry/Scaly: No Periwound Skin Color: Atrophie Blanche: No N/A N/A Cyanosis: No Ecchymosis: No Erythema: No Hemosiderin Staining: No Mottled: No Pallor: No Rubor: No Temperature: No Abnormality N/A N/A Tenderness on No N/A N/A Palpation: Wound Preparation: Ulcer Cleansing:  N/A N/A Rinsed/Irrigated with Saline Topical Anesthetic Applied: Other: lidocaine 4% Treatment Notes Electronic Signature(s) Signed: 09/23/2015 3:39:38 PM By: Elpidio EricAfful, Rita BSN, RN Entered By: Elpidio EricAfful, Rita on 09/23/2015 14:05:18 Thielman, Jameica R. ([id number]) -------------------------------------------------------------------------------- Multi-Disciplinary Care Plan Details Patient Name: Sunny SchleinWOOTEN, Ulah R. Date of Service: 09/23/2015 1:30 PM Medical Record Number: [id number] Patient Account Number: [id number] Date of Birth/Sex: [birth year] (82 y.o. Female) Treating RN: Afful, RN, BSN, [place] Sinkita Primary Care Physician: Jodi MourningWalker III, John Other Clinician: Referring Physician: Jodi MourningWalker III, John Treating Physician/Extender: Rudene ReBritto, Errol Weeks in Treatment: 3 Active Inactive Abuse / Safety / Falls / Self Care Management Nursing Diagnoses: Potential for falls Goals: Patient will remain injury free Date Initiated: 09/02/2015 Goal Status: Active Interventions: Assess fall risk on admission and as needed Notes: Nutrition Nursing Diagnoses: Imbalanced nutrition Goals: Patient/caregiver agrees to and verbalizes understanding of  need to obtain nutritional consultation Date Initiated: 09/02/2015 Goal Status: Active Interventions: Provide education on nutrition Treatment Activities: Education provided on Nutrition : 09/16/2015 Notes: Orientation to the Wound Care Program Nursing Diagnoses: Knowledge deficit related to the wound healing center program FruitlandWOOTEN, Quanasia R. ([id number]) Goals: Patient/caregiver will verbalize understanding of the Wound Healing Center Program Date Initiated: 09/02/2015 Goal Status: Active Interventions: Provide education on orientation to the wound center Notes: Wound/Skin Impairment Nursing Diagnoses: Impaired tissue integrity Goals: Ulcer/skin breakdown will heal within 14 weeks Date Initiated: 09/02/2015 Goal Status: Active Interventions: Assess patient/caregiver ability to obtain necessary supplies Notes: Electronic Signature(s) Signed: 09/23/2015 3:39:38 PM By: Elpidio EricAfful, Rita BSN, RN Entered By: Elpidio EricAfful, Rita on 09/23/2015 14:05:11 Laakso, Sayda R. ([id number]) -------------------------------------------------------------------------------- Pain Assessment Details Patient Name: Sunny SchleinWOOTEN, Katena R. Date of Service: 09/23/2015 1:30 PM Medical Record Number: [id number] Patient Account Number: [id number] Date of Birth/Sex: [birth year] (82 y.o. Female) Treating RN: Clover MealyAfful, RN, BSN, [place] Sinkita Primary Care Physician: Jodi MourningWalker III, John Other Clinician: Referring Physician: Jodi MourningWalker III, John Treating Physician/Extender: Rudene ReBritto, Errol Weeks in Treatment: 3 Active Problems Location of Pain Severity and Description of Pain Patient Has Paino No Site Locations With Dressing Change: No Pain Management and Medication Current Pain Management: Electronic Signature(s) Signed: 09/23/2015 3:39:38 PM By: Elpidio EricAfful, Rita BSN, RN Entered By: Elpidio EricAfful, Rita on 09/23/2015 13:58:18 Depp, Reighan R. ([id number]) -------------------------------------------------------------------------------- Patient/Caregiver Education  Details Patient Name: Sunny SchleinWOOTEN, Teryn R. Date of Service: 09/23/2015 1:30 PM Medical Record Number: [id number] Patient Account Number: [id number] Date of Birth/Gender: [birth year] (82 y.o. Female) Treating RN: Clover MealyAfful, RN, BSN, [place] Sinkita Primary Care Physician: Jodi MourningWalker III, John Other Clinician: Referring Physician: Jodi MourningWalker III, John Treating Physician/Extender: Rudene ReBritto, Errol Weeks in Treatment: 3 Education Assessment Education Provided To: Patient Education Topics Provided Nutrition: Methods: Explain/Verbal Responses: State content correctly Welcome To The Wound Care Center: Methods: Explain/Verbal Responses: State content correctly Wound/Skin Impairment: Methods: Explain/Verbal Responses: State content correctly Electronic Signature(s) Signed: 09/23/2015 3:39:38 PM By: Elpidio EricAfful, Rita BSN, RN Entered By: Elpidio EricAfful, Rita on 09/23/2015 14:08:13 Heinzel, Seva R. ([id number]) -------------------------------------------------------------------------------- Wound Assessment Details Patient Name: Hulen, Zara R. Date of Service: 09/23/2015 1:30 PM Medical Record Number: [id number] Patient Account Number: [id number] Date of Birth/Sex: [birth year] (82 y.o. Female) Treating RN: Clover MealyAfful, RN, BSN,  Sinkita Primary Care Physician: Jodi MourningWalker III, John Other Clinician: Referring Physician: Jodi MourningWalker III, John Treating Physician/Extender: Rudene ReBritto, Errol Weeks in Treatment: 3 Wound Status Wound Number: 1 Primary Abscess Etiology: Wound Location: Right Gluteus Wound Open Wounding Event: Bump Status: Date Acquired: 08/26/2015 Comorbid Chronic Obstructive Pulmonary Disease Weeks Of Treatment: 3 History: (COPD), Congestive Heart Failure, Clustered Wound: No Hypertension, Osteoarthritis Photos Photo Uploaded By: Elpidio EricAfful, Rita on 09/23/2015 15:09:27 Wound Measurements  Length: (cm) 1 Width: (cm) 0.5 Depth: (cm) 0.1 Area: (cm) 0.393 Volume: (cm) 0.039 % Reduction in Area: 16.6% % Reduction in Volume:  58.5% Epithelialization: Small (1-33%) Tunneling: No Undermining: No Wound Description Classification: Partial Thickness Wound Margin: Distinct, outline attached Exudate Amount: Medium Exudate Type: Serosanguineous Exudate Color: red, brown Foul Odor After Cleansing: No Wound Bed Granulation Amount: Medium (34-66%) Exposed Structure Granulation Quality: Pink, Pale Fascia Exposed: No Necrotic Amount: Medium (34-66%) Fat Layer Exposed: No Necrotic Quality: Adherent Slough Tendon Exposed: No Ghazi, Marita R. ([id number]) Muscle Exposed: No Joint Exposed: No Bone Exposed: No Limited to Skin Breakdown Periwound Skin Texture Texture Color No Abnormalities Noted: No No Abnormalities Noted: No Callus: No Atrophie Blanche: No Crepitus: No Cyanosis: No Excoriation: No Ecchymosis: No Fluctuance: No Erythema: No Friable: No Hemosiderin Staining: No Induration: No Mottled: No Localized Edema: No Pallor: No Rash: No Rubor: No Scarring: No Temperature / Pain Moisture Temperature: No Abnormality No Abnormalities Noted: No Dry / Scaly: No Maceration: No Moist: Yes Wound Preparation Ulcer Cleansing: Rinsed/Irrigated with Saline Topical Anesthetic Applied: Other: lidocaine 4%, Treatment Notes Wound #1 (Right Gluteus) 1. Cleansed with: Clean wound with Normal Saline 4. Dressing Applied: Aquacel Ag 5. Secondary Dressing Applied Bordered Foam Dressing Electronic Signature(s) Signed: 09/23/2015 3:39:38 PM By: Elpidio Eric BSN, RN Entered By: Elpidio Eric on 09/23/2015 13:58:41 Giron, Arthur R. ([id number]) -------------------------------------------------------------------------------- Vitals Details Patient Name: [name], Janani R. Date of Service: 09/23/2015 1:30 PM Medical Record Number: [id number] Patient Account Number: [id number] Date of Birth/Sex: 26-Jul-1923 (82 y.o. Female) Treating RN: Afful, RN, BSN, [place] Sink Primary Care Physician: Jodi Mourning Other  Clinician: Referring Physician: Jodi Mourning Treating Physician/Extender: Rudene Re in Treatment: 3 Vital Signs Time Taken: 13:58 Temperature (F): 97.6 Height (in): 60 Pulse (bpm): 78 Weight (lbs): 195 Respiratory Rate (breaths/min): 17 Body Mass Index (BMI): 38.1 Blood Pressure (mmHg): 148/54 Reference Range: 80 - 120 mg / dl Electronic Signature(s) Signed: 09/23/2015 3:39:38 PM By: Elpidio Eric BSN, RN Entered By: Elpidio Eric on 09/23/2015 13:59:27

## 2015-09-30 ENCOUNTER — Encounter: Payer: Self-pay | Admitting: General Surgery

## 2015-09-30 ENCOUNTER — Encounter (HOSPITAL_BASED_OUTPATIENT_CLINIC_OR_DEPARTMENT_OTHER): Payer: Medicare Other | Admitting: General Surgery

## 2015-09-30 DIAGNOSIS — L0232 Furuncle of buttock: Secondary | ICD-10-CM | POA: Diagnosis not present

## 2015-09-30 DIAGNOSIS — L0233 Carbuncle of buttock: Secondary | ICD-10-CM

## 2015-09-30 DIAGNOSIS — S31819A Unspecified open wound of right buttock, initial encounter: Secondary | ICD-10-CM | POA: Diagnosis not present

## 2015-09-30 NOTE — Progress Notes (Signed)
Continue to offload and dress withPrism

## 2015-10-02 ENCOUNTER — Encounter: Payer: Self-pay | Admitting: Pain Medicine

## 2015-10-02 ENCOUNTER — Ambulatory Visit: Payer: Medicare Other | Attending: Pain Medicine | Admitting: Pain Medicine

## 2015-10-02 VITALS — BP 148/67 | HR 71 | Temp 97.5°F | Resp 16

## 2015-10-02 DIAGNOSIS — M4806 Spinal stenosis, lumbar region: Secondary | ICD-10-CM | POA: Diagnosis not present

## 2015-10-02 DIAGNOSIS — G5600 Carpal tunnel syndrome, unspecified upper limb: Secondary | ICD-10-CM

## 2015-10-02 DIAGNOSIS — R51 Headache: Secondary | ICD-10-CM | POA: Diagnosis present

## 2015-10-02 DIAGNOSIS — M47816 Spondylosis without myelopathy or radiculopathy, lumbar region: Secondary | ICD-10-CM | POA: Diagnosis not present

## 2015-10-02 DIAGNOSIS — M7060 Trochanteric bursitis, unspecified hip: Secondary | ICD-10-CM | POA: Insufficient documentation

## 2015-10-02 DIAGNOSIS — M7061 Trochanteric bursitis, right hip: Secondary | ICD-10-CM

## 2015-10-02 DIAGNOSIS — M533 Sacrococcygeal disorders, not elsewhere classified: Secondary | ICD-10-CM

## 2015-10-02 DIAGNOSIS — G5603 Carpal tunnel syndrome, bilateral upper limbs: Secondary | ICD-10-CM

## 2015-10-02 DIAGNOSIS — M170 Bilateral primary osteoarthritis of knee: Secondary | ICD-10-CM | POA: Insufficient documentation

## 2015-10-02 DIAGNOSIS — R609 Edema, unspecified: Secondary | ICD-10-CM | POA: Diagnosis not present

## 2015-10-02 DIAGNOSIS — M19011 Primary osteoarthritis, right shoulder: Secondary | ICD-10-CM | POA: Diagnosis not present

## 2015-10-02 DIAGNOSIS — M48062 Spinal stenosis, lumbar region with neurogenic claudication: Secondary | ICD-10-CM

## 2015-10-02 DIAGNOSIS — M542 Cervicalgia: Secondary | ICD-10-CM | POA: Diagnosis present

## 2015-10-02 DIAGNOSIS — M75121 Complete rotator cuff tear or rupture of right shoulder, not specified as traumatic: Secondary | ICD-10-CM | POA: Diagnosis not present

## 2015-10-02 DIAGNOSIS — M5030 Other cervical disc degeneration, unspecified cervical region: Secondary | ICD-10-CM

## 2015-10-02 DIAGNOSIS — M7062 Trochanteric bursitis, left hip: Secondary | ICD-10-CM

## 2015-10-02 DIAGNOSIS — M19012 Primary osteoarthritis, left shoulder: Secondary | ICD-10-CM

## 2015-10-02 DIAGNOSIS — M5136 Other intervertebral disc degeneration, lumbar region: Secondary | ICD-10-CM

## 2015-10-02 MED ORDER — HYDROCODONE-ACETAMINOPHEN 5-325 MG PO TABS: ORAL_TABLET | ORAL | Status: DC

## 2015-10-02 MED ORDER — TRAMADOL HCL 50 MG PO TABS: ORAL_TABLET | ORAL | Status: DC

## 2015-10-02 NOTE — Progress Notes (Signed)
Subjective:    Patient ID: Marisa [name], female    DOB: 1924/01/23, 82 y.o.   MRN: [id number]  HPI  The patient is a 82 year old female who returns to pain management for further evaluation and treatment of pain involving the neck associated with headaches as well as pain involving shoulder upper back mid back lower back and lower extremity regions. On today's visit we discussed patient's condition including the right shoulder and patient will undergo follow-up evaluation with Dr.[name] and Ignacia Bayley in this regard. The patient is with history of lesion of the right gluteal region and continues treatment in the wound care clinic at this time. The patient will also follow up with Dr. [name] III for further assessment of lesion of the right gluteal region. The patient is with some evidence of pain of the left lower extremity and evaluation revealed patient to be with negative clonus negative Homans without increased warmth or erythema of the lower extremity and without evidence of swelling all pain involving the regions of the regions of the calf. We discussed patient's condition and will continue hydrocodone acetaminophen and tramadol as discussed and patient is to call pain management should they be significant change in condition prior to scheduled return appointment already to suggested treatment plan   Review of Systems     Objective:   Physical Exam  There was tenderness of the splenius capitis and occipitalis region a mild degree with mild tenderness of the cervical and thoracic facet regions. No crepitus of the thoracic region was noted. Palpation of the acromioclavicular and glenohumeral joint region reproduced severely disabling pain on the right compared to the left there was. There was decreased range of motion of the shoulder with decreased grip strength with moderate increase of pain with Tinel and Phalen's maneuver palpation of the right shoulder with visual threat severe pain.  Palpation of the thoracic region other tenderness to palpation right greater than left with tenderness of the greater trochanteric region iliotibial band region. Straight leg raise was tolerates approximately 20 without increase of pain with dorsiflexion noted. There was tenderness of the left lower extremity with no increased warmth or erythema and there was negative clonus negative Homans of the left lower extremity as well as the right lower extremity. No sensory deficit or dermatomal distribution detected. There was negative clonus negative Homans. Abdomen nontender with no costovertebral tenderness noted      Assessment & Plan:         Degenerative joint disease of right shoulder with history of trauma producing additional changes of the right shoulder  DegeneraComplete supraspinatus and infraspinatus tendon tears with 4-5 cm of retraction. Edema in the infraspinatus muscle belly is likely due to subacute injury given history of trauma. Denervation atrophy is thought less likely. All imaged musculature demonstrates some fatty replacement.  Complete tear of the long head of biceps from the superior labrum.  Bulky acromioclavicular osteoarthritis. Glenohumeral degenerative change is also seen.ive disc disease lumbar spine Multilevel degenerative changes lumbar spine lumbar stenosis multiple levels L3-4 L4-5 L1-2 and L2-3. Lumbar facet degenerative changes, foraminal narrowing, ligamentum flavum hypertrophy  Lumbar stenosis with with neurogenic claudication  Lumbar facet syndrome  Greater trochanteric bursitis  Degenerative joint disease of knees      PLAN   Continue present medications tramadol and hydrocodone acetaminophen Continue Voltaren gel applications. May apply to the right lower extremity and to shoulder as we previously discussed  F/U PCP Dr. [name] III  for evaliation of  BP lesion of the right gluteal region and to watch for any swelling, erythema, or  increased pain of the left lower extremity as well as to follow-up ofneral medical  condition.   F/U surgical evaluation. May consider further evaluation of lumbar and lower extremity pain pending follow-up evaluations . Patient has undergone prior surgical evaluation and without plans for surgical intervention due to general medical condition. Follow-up with Dr.[name] and with [name] regarding shoulder as previously discussed   F/U Dr. Joice LoftsPoggi for follow-up evaluation of right shoulder    F/U neurological evaluation. May consider pending follow-up evaluations We will avoid [facility] EMG studies and such studies at this time  Wound care clinic follow-up evaluation. Please continue to follow-up with wound care clinic for evaluation of lesion of the right gluteal region as you are  presently doing  May consider radiofrequency rhizolysis or intraspinal procedures pending response to present treatment and F/U evaluation  We will avoid such treatment at this time  Patient to call Pain Management Center should patient have concerns prior to scheduled return appointment

## 2015-10-02 NOTE — Progress Notes (Signed)
Safety precautions to be maintained throughout the outpatient stay will include: orient to surroundings, keep bed in low position, maintain call bell within reach at all times, provide assistance with transfer out of bed and ambulation.  

## 2015-10-02 NOTE — Patient Instructions (Addendum)
PLAN   Continue present medications tramadol and hydrocodone acetaminophen Continue Voltaren gel applications. May apply to the right lower extremity and to shoulder as we previously discussed  F/U PCP Dr. Yates DecampJohn Walker III  for evaliation of  BP lesion of the right gluteal region and to watch for any swelling, erythema, or increased pain of the left lower extremity as well as to follow-up ofneral medical  condition.   F/U surgical evaluation. May consider further evaluation of lumbar and lower extremity pain pending follow-up evaluations . Patient has undergone prior surgical evaluation and without plans for surgical intervention due to general medical condition. Follow-up with Dr.[name] and with [name] regarding shoulder as previously discussed   F/U Dr. Joice LoftsPoggi for follow-up evaluation of right shoulder    F/U neurological evaluation. May consider pending follow-up evaluations We will avoid [facility] EMG studies and such studies at this time  Wound care clinic follow-up evaluation. Please follow-up with wound care clinic for evaluation of lesion of the right gluteal region as you're presently doing  May consider radiofrequency rhizolysis or intraspinal procedures pending response to present treatment and F/U evaluation  We will avoid such treatment at this time  Patient to call Pain Management Center should patient have concerns prior to scheduled return appointment

## 2015-10-07 ENCOUNTER — Encounter: Payer: Medicare Other | Admitting: Surgery

## 2015-10-07 DIAGNOSIS — S31819A Unspecified open wound of right buttock, initial encounter: Secondary | ICD-10-CM | POA: Diagnosis not present

## 2015-10-07 NOTE — Progress Notes (Signed)
HALEIGH, DESMITH ([id number]) Visit Report for 10/07/2015 Chief Complaint Document Details Patient Name: Marisa [name], Marisa [name] 10/07/2015 10:00 Date of Service: AM Medical Record [id number] Number: Patient Account Number: [id number] 04-Feb-1924 (82 y.o. Treating RN: Afful, RN, BSN, [place] Sink Date of Birth/Sex: Female) Other Clinician: Primary Care Physician: Kennieth Francois Referring Physician: Jodi Mourning Physician/Extender: Weeks in Treatment: 5 Information Obtained from: Patient Chief Complaint Patient presents to the wound care center for a consult due non healing wound to the right gluteal area which she's had for about 3 weeks. Electronic Signature(s) Signed: 10/07/2015 10:30:28 AM By: Evlyn Kanner MD, FACS Entered By: Evlyn Kanner on 10/07/2015 10:30:28 Gordy Savers ([id number]) -------------------------------------------------------------------------------- HPI Details Patient Name: Marisa [name], [name] 10/07/2015 10:00 Date of Service: AM Medical Record [id number] Number: Patient Account Number: [id number] 1924/01/11 (82 y.o. Treating RN: Clover Mealy, RN, BSN, [place] Sink Date of Birth/Sex: Female) Other Clinician: Primary Care Physician: Kennieth Francois Referring Physician: Jodi Mourning Physician/Extender: Weeks in Treatment: 5 History of Present Illness Location: right gluteal area ulcer Quality: Patient reports experiencing a dull pain to affected area(s). Severity: Patient states wound are getting worse. Duration: Patient has had the wound for < 3 weeks prior to presenting for treatment Timing: Pain in wound is Intermittent (comes and goes Context: The wound would happen gradually Modifying Factors: Other treatment(s) tried include:Diflucan and some Lotrisone cream Associated Signs and Symptoms: Patient reports having difficulty sitting for long periods. HPI Description: 82 year old patient who was seen by the PA at the internal  medicine practice [facility]. The patient has been referred to others with what looks like a fungal infection in the perineal area with some open wound which was referred for an opinion. The patient's past medical history significant for hiatal hernia, osteoarthritis, status post appendectomy, cholecystectomy, knee arthroscopy. The patient is not been a smoker. The patient was put on Diflucan 100 mg daily for 3 days and was asked to use Lotrisone cream twice daily. Moisture barrier was also recommended and off loading was suggested. the ulcerated areas on the right gluteal region and started off as a bump and then opened out into an ulcer. Electronic Signature(s) Signed: 10/07/2015 10:30:36 AM By: Evlyn Kanner MD, FACS Entered By: Evlyn Kanner on 10/07/2015 10:30:36 Gordy Savers ([id number]) -------------------------------------------------------------------------------- Physical Exam Details Patient Name: Marisa [name], [name] 10/07/2015 10:00 Date of Service: AM Medical Record [id number] Number: Patient Account Number: [id number] 1923/09/29 (82 y.o. Treating RN: Clover Mealy, RN, BSN, [place] Sink Date of Birth/Sex: Female) Other Clinician: Primary Care Physician: Kennieth Francois Referring Physician: Jodi Mourning Physician/Extender: Weeks in Treatment: 5 Constitutional . Pulse regular. Respirations normal and unlabored. Afebrile. . Eyes Nonicteric. Reactive to light. Ears, Nose, Mouth, and Throat Lips, teeth, and gums WNL.Marland Kitchen Moist mucosa without lesions. Neck supple and nontender. No palpable supraclavicular or cervical adenopathy. Normal sized without goiter. Respiratory WNL. No retractions.. Cardiovascular Pedal Pulses WNL. No clubbing, cyanosis or edema. Lymphatic No adneopathy. No adenopathy. No adenopathy. Musculoskeletal Adexa without tenderness or enlargement.. Digits and nails w/o clubbing, cyanosis, infection, petechiae, ischemia, or inflammatory  conditions.. Integumentary (Hair, Skin) No suspicious lesions. No crepitus or fluctuance. No peri-wound warmth or erythema. No masses.Marland Kitchen Psychiatric Judgement and insight Intact.. No evidence of depression, anxiety, or agitation.. Notes the wound on the right buttock is completely healed and there is supple scar tissue Electronic Signature(s) Signed: 10/07/2015 10:31:07 AM By: Evlyn Kanner MD, FACS Entered By: Evlyn Kanner on 10/07/2015 10:31:07 [place], Deauna  Elvera Lennox ([id number]) -------------------------------------------------------------------------------- Physician Orders Details Patient Name: Marisa [name], Marisa [name] 10/07/2015 10:00 Date of Service: AM Medical Record [id number] Number: Patient Account Number: [id number] May 16, 1923 (82 y.o. Treating RN: Clover Mealy, RN, BSN, [place] Sink Date of Birth/Sex: Female) Other Clinician: Primary Care Physician: Kennieth Francois Referring Physician: Jodi Mourning Physician/Extender: Tania Ade in Treatment: 5 Verbal / Phone Orders: Yes Clinician: Afful, RN, BSN, [place] Read Back and Verified: Yes Diagnosis Coding Discharge From [facility] Services o Discharge from Wound Care Center - Treatment completed Electronic Signature(s) Signed: 10/07/2015 3:20:24 PM By: Elpidio Eric BSN, RN Signed: 10/07/2015 3:43:28 PM By: Evlyn Kanner MD, FACS Entered By: Elpidio Eric on 10/07/2015 10:12:32 Gordy Savers ([id number]) -------------------------------------------------------------------------------- Problem List Details Patient Name: Marisa [name], [name] 10/07/2015 10:00 Date of Service: AM Medical Record [id number] Number: Patient Account Number: [id number] 05-31-1923 (82 y.o. Treating RN: Clover Mealy, RN, BSN, [place] Sink Date of Birth/Sex: Female) Other Clinician: Primary Care Physician: Kennieth Francois Referring Physician: Jodi Mourning Physician/Extender: Tania Ade in Treatment: 5 Active Problems ICD-10 Encounter Code Description  Active Date Diagnosis S31.819A Unspecified open wound of right buttock, initial encounter 09/02/2015 Yes L02.32 Furuncle of buttock 09/02/2015 Yes Inactive Problems Resolved Problems Electronic Signature(s) Signed: 10/07/2015 10:30:20 AM By: Evlyn Kanner MD, FACS Entered By: Evlyn Kanner on 10/07/2015 10:30:20 Gordy Savers ([id number]) -------------------------------------------------------------------------------- Progress Note Details Patient Name: Marisa Officer R. 10/07/2015 10:00 Date of Service: AM Medical Record [id number] Number: Patient Account Number: [id number] January 20, 1924 (82 y.o. Treating RN: Afful, RN, BSN, [place] Sink Date of Birth/Sex: Female) Other Clinician: Primary Care Physician: Kennieth Francois Referring Physician: Jodi Mourning Physician/Extender: Weeks in Treatment: 5 Subjective Chief Complaint Information obtained from Patient Patient presents to the wound care center for a consult due non healing wound to the right gluteal area which she's had for about 3 weeks. History of Present Illness (HPI) The following HPI elements were documented for the patient's wound: Location: right gluteal area ulcer Quality: Patient reports experiencing a dull pain to affected area(s). Severity: Patient states wound are getting worse. Duration: Patient has had the wound for < 3 weeks prior to presenting for treatment Timing: Pain in wound is Intermittent (comes and goes Context: The wound would happen gradually Modifying Factors: Other treatment(s) tried include:Diflucan and some Lotrisone cream Associated Signs and Symptoms: Patient reports having difficulty sitting for long periods. 82 year old patient who was seen by the PA at the internal medicine practice [facility]. The patient has been referred to others with what looks like a fungal infection in the perineal area with some open wound which was referred for an opinion. The patient's past  medical history significant for hiatal hernia, osteoarthritis, status post appendectomy, cholecystectomy, knee arthroscopy. The patient is not been a smoker. The patient was put on Diflucan 100 mg daily for 3 days and was asked to use Lotrisone cream twice daily. Moisture barrier was also recommended and off loading was suggested. the ulcerated areas on the right gluteal region and started off as a bump and then opened out into an ulcer. Objective Constitutional Pulse regular. Respirations normal and unlabored. Afebrile. Vitals Time Taken: 10:06 AM, Height: 60 in, Weight: 195 lbs, BMI: 38.1, Temperature: 97.6 F, Pulse: 76 Marisa [name], [name] ([id number]) bpm, Respiratory Rate: 16 breaths/min, Blood Pressure: 149/67 mmHg. Eyes Nonicteric. Reactive to light. Ears, Nose, Mouth, and Throat Lips, teeth, and gums WNL.Marland Kitchen Moist mucosa without lesions. Neck supple and nontender. No palpable supraclavicular or cervical adenopathy. Normal sized without  goiter. Respiratory WNL. No retractions.. Cardiovascular Pedal Pulses WNL. No clubbing, cyanosis or edema. Lymphatic No adneopathy. No adenopathy. No adenopathy. Musculoskeletal Adexa without tenderness or enlargement.. Digits and nails w/o clubbing, cyanosis, infection, petechiae, ischemia, or inflammatory conditions.Marland Kitchen. Psychiatric Judgement and insight Intact.. No evidence of depression, anxiety, or agitation.. General Notes: the wound on the right buttock is completely healed and there is supple scar tissue Integumentary (Hair, Skin) No suspicious lesions. No crepitus or fluctuance. No peri-wound warmth or erythema. No masses.. Wound #1 status is Healed - Epithelialized. Original cause of wound was Bump. The wound is located on the Right Gluteus. The wound measures 0cm length x 0cm width x 0cm depth; 0cm^2 area and 0cm^3 volume. The wound is limited to skin breakdown. There is no tunneling or undermining noted. There is a none present amount of  drainage noted. The wound margin is distinct with the outline attached to the wound base. There is no granulation within the wound bed. There is no necrotic tissue within the wound bed. The periwound skin appearance exhibited: Dry/Scaly. The periwound skin appearance did not exhibit: Callus, Crepitus, Excoriation, Fluctuance, Friable, Induration, Localized Edema, Rash, Scarring, Maceration, Moist, Atrophie Blanche, Cyanosis, Ecchymosis, Hemosiderin Staining, Mottled, Pallor, Rubor, Erythema. Periwound temperature was noted as No Abnormality. Assessment Marisa [name], [name] ([id number]) Active Problems ICD-10 S31.819A - Unspecified open wound of right buttock, initial encounter L02.32 - Furuncle of buttock [facility] - Treatment completed Having healed out her wound I have asked her to continue to apply a bordered foam dressing and to continue to offload as she has been doing these past several weeks. Discharge from the wound care services and will see us back as needed. Electronic Signature(s) Signed: 10/07/2015 10:31:48 AM By: Evlyn KannerBritto, Sundance Moise MD, FACS Entered By: Evlyn KannerBritto, Athanasia Stanwood on 10/07/2015 10:31:48 Marisa [name], [name] ([id number]) -------------------------------------------------------------------------------- SuperBill Details Patient Name: Marisa [name], [name] Date of Service: 10/07/2015 Medical Record Patient Account Number: [id number] Number: Afful, RN, BSN, Treating RN: 1923/11/15 (82 y.o. [place] Date of Birth/Sex: Female) Other Clinician: Primary Care Physician: Kennieth FrancoisWalker III, John Treating Enio Hornback Referring Physician: Jodi MourningWalker III, John Physician/Extender: Tania AdeWeeks in Treatment: 5 Diagnosis Coding ICD-10 Codes Code Description 3397889052S31.819A Unspecified open wound of right buttock, initial encounter L02.32 Furuncle of buttock Facility Procedures CPT4 Code: [id number] Description: [id number] - WOUND CARE VISIT-LEV 2 EST  PT Modifier: Quantity: 1 Physician Procedures CPT4 Code: [id number] Description: [id number] - WC PHYS LEVEL 2 - EST PT ICD-10 Description Diagnosis S31.819A Unspecified open wound of right buttock, initial L02.32 Furuncle of buttock Modifier: encounter Quantity: 1 Electronic Signature(s) Signed: 10/07/2015 10:31:59 AM By: Evlyn KannerBritto, Alanni Vader MD, FACS Entered By: Evlyn KannerBritto, Pranavi Aure on 10/07/2015 10:31:59

## 2015-10-07 NOTE — Progress Notes (Signed)
MOLLI, GETHERS ([id number]) Visit Report for 10/07/2015 Arrival Information Details Patient Name: Marisa [name], Marisa [name]. Date of Service: 10/07/2015 10:00 AM Medical Record Number: [id number] Patient Account Number: [id number] Date of Birth/Sex: [birth year] (82 y.o. Female) Treating RN: Afful, RN, BSN, [place] Sink Primary Care Physician: Jodi Mourning Other Clinician: Referring Physician: Jodi Mourning Treating Physician/Extender: Rudene Re in Treatment: 5 Visit Information History Since Last Visit Added or deleted any medications: No Patient Arrived: Walker Any new allergies or adverse reactions: No Arrival Time: 10:05 Had a fall or experienced change in No Accompanied By: self activities of daily living that may affect Transfer Assistance: None risk of falls: Patient Identification Verified: Yes Signs or symptoms of abuse/neglect since last No Secondary Verification Process Yes visito Completed: Has Dressing in Place as Prescribed: Yes Patient Requires Transmission- No Pain Present Now: No Based Precautions: Patient Has Alerts: Yes Patient Alerts: Patient on Blood Thinner Aspirin Electronic Signature(s) Signed: 10/07/2015 3:20:24 PM By: Elpidio Eric BSN, RN Entered By: Elpidio Eric on 10/07/2015 10:06:22 Hanken, Javier R. ([id number]) -------------------------------------------------------------------------------- Clinic Level of Care Assessment Details Patient Name: [name], [name] Date of Service: 10/07/2015 10:00 AM Medical Record Number: [id number] Patient Account Number: [id number] Date of Birth/Sex: 1924-02-26 (82 y.o. Female) Treating RN: Afful, RN, BSN, [place] Sink Primary Care Physician: Jodi Mourning Other Clinician: Referring Physician: Jodi Mourning Treating Physician/Extender: Rudene Re in Treatment: 5 Clinic Level of Care Assessment Items TOOL 4 Quantity Score []  - Use when only an EandM is performed on FOLLOW-UP visit 0 ASSESSMENTS - Nursing  Assessment / Reassessment X - Reassessment of Co-morbidities (includes updates in patient status) 1 10 X - Reassessment of Adherence to Treatment Plan 1 5 ASSESSMENTS - Wound and Skin Assessment / Reassessment X - Simple Wound Assessment / Reassessment - one wound 1 5 []  - Complex Wound Assessment / Reassessment - multiple wounds 0 []  - Dermatologic / Skin Assessment (not related to wound area) 0 ASSESSMENTS - Focused Assessment []  - Circumferential Edema Measurements - multi extremities 0 []  - Nutritional Assessment / Counseling / Intervention 0 []  - Lower Extremity Assessment (monofilament, tuning fork, pulses) 0 []  - Peripheral Arterial Disease Assessment (using hand held doppler) 0 ASSESSMENTS - Ostomy and/or Continence Assessment and Care []  - Incontinence Assessment and Management 0 []  - Ostomy Care Assessment and Management (repouching, etc.) 0 PROCESS - Coordination of Care X - Simple Patient / Family Education for ongoing care 1 15 []  - Complex (extensive) Patient / Family Education for ongoing care 0 []  - Staff obtains Chiropractor, Records, Test Results / Process Orders 0 []  - Staff telephones HHA, Nursing Homes / Clarify orders / etc 0 []  - Routine Transfer to another Facility (non-emergent condition) 0 Dortch, Joyceann R. ([id number]) []  - Routine Hospital Admission (non-emergent condition) 0 []  - New Admissions / Manufacturing engineer / Ordering NPWT, Apligraf, etc. 0 []  - Emergency Hospital Admission (emergent condition) 0 []  - Simple Discharge Coordination 0 []  - Complex (extensive) Discharge Coordination 0 PROCESS - Special Needs []  - Pediatric / Minor Patient Management 0 []  - Isolation Patient Management 0 []  - Hearing / Language / Visual special needs 0 []  - Assessment of Community assistance (transportation, D/C planning, etc.) 0 []  - Additional assistance / Altered mentation 0 []  - Support Surface(s) Assessment (bed, cushion, seat, etc.) 0 INTERVENTIONS - Wound  Cleansing / Measurement X - Simple Wound Cleansing - one wound 1 5 []  - Complex Wound Cleansing - multiple wounds 0 X - Wound  Imaging (photographs - any number of wounds) 1 5 []  - Wound Tracing (instead of photographs) 0 []  - Simple Wound Measurement - one wound 0 []  - Complex Wound Measurement - multiple wounds 0 INTERVENTIONS - Wound Dressings []  - Small Wound Dressing one or multiple wounds 0 []  - Medium Wound Dressing one or multiple wounds 0 []  - Large Wound Dressing one or multiple wounds 0 []  - Application of Medications - topical 0 []  - Application of Medications - injection 0 INTERVENTIONS - Miscellaneous []  - External ear exam 0 Hovater, Miaa R. ([id number]) []  - Specimen Collection (cultures, biopsies, blood, body fluids, etc.) 0 []  - Specimen(s) / Culture(s) sent or taken to Lab for analysis 0 []  - Patient Transfer (multiple staff / Michiel Sites Lift / Similar devices) 0 []  - Simple Staple / Suture removal (25 or less) 0 []  - Complex Staple / Suture removal (26 or more) 0 []  - Hypo / Hyperglycemic Management (close monitor of Blood Glucose) 0 []  - Ankle / Brachial Index (ABI) - do not check if billed separately 0 X - Vital Signs 1 5 Has the patient been seen at the hospital within the last three years: Yes Total Score: 50 Level Of Care: New/Established - Level 2 Electronic Signature(s) Signed: 10/07/2015 3:20:24 PM By: Elpidio Eric BSN, RN Entered By: Elpidio Eric on 10/07/2015 10:20:23 Eddleman, Viviane R. ([id number]) -------------------------------------------------------------------------------- Encounter Discharge Information Details Patient Name: Marisa Schlein, [name] Date of Service: 10/07/2015 10:00 AM Medical Record Number: [id number] Patient Account Number: [id number] Date of Birth/Sex: Jan 01, 1924 (82 y.o. Female) Treating RN: Clover Mealy, RN, BSN, [place] Sink Primary Care Physician: Jodi Mourning Other Clinician: Referring Physician: Jodi Mourning Treating Physician/Extender: Rudene Re in Treatment: 5 Encounter Discharge Information Items Discharge Pain Level: 0 Discharge Condition: Stable Ambulatory Status: Walker Discharge Destination: Home Transportation: Private Auto Accompanied By: self Schedule Follow-up Appointment: No Medication Reconciliation completed No and provided to Patient/Care Ulyses Panico: Provided on Clinical Summary of Care: 10/07/2015 Form Type Recipient Paper Patient GW Electronic Signature(s) Signed: 10/07/2015 10:21:02 AM By: Elpidio Eric BSN, RN Previous Signature: 10/07/2015 10:13:00 AM Version By: Gwenlyn Perking Entered By: Elpidio Eric on 10/07/2015 10:21:02 Minks, Brayli R. ([id number]) -------------------------------------------------------------------------------- Lower Extremity Assessment Details Patient Name: Bieri, Mahaley R. Date of Service: 10/07/2015 10:00 AM Medical Record Number: [id number] Patient Account Number: [id number] Date of Birth/Sex: [birth year] (82 y.o. Female) Treating RN: Afful, RN, BSN, [place] Sink Primary Care Physician: Jodi Mourning Other Clinician: Referring Physician: Jodi Mourning Treating Physician/Extender: Rudene Re in Treatment: 5 Electronic Signature(s) Signed: 10/07/2015 3:20:24 PM By: Elpidio Eric BSN, RN Entered By: Elpidio Eric on 10/07/2015 10:07:02 Merrihew, Vanissa R. ([id number]) -------------------------------------------------------------------------------- Multi Wound Chart Details Patient Name: Deliz, Sarayah R. Date of Service: 10/07/2015 10:00 AM Medical Record Number: [id number] Patient Account Number: [id number] Date of Birth/Sex: 04-22-1923 (82 y.o. Female) Treating RN: Clover Mealy, RN, BSN, [place] Sink Primary Care Physician: Jodi Mourning Other Clinician: Referring Physician: Jodi Mourning Treating Physician/Extender: Rudene Re in Treatment: 5 Vital Signs Height(in): 60 Pulse(bpm): 76 Weight(lbs): 195 Blood Pressure 149/67 (mmHg): Body Mass Index(BMI):  38 Temperature(F): 97.6 Respiratory Rate 16 (breaths/min): Photos: [1:No Photos] [N/A:N/A] Wound Location: [1:Right Gluteus] [N/A:N/A] Wounding Event: [1:Bump] [N/A:N/A] Primary Etiology: [1:Abscess] [N/A:N/A] Comorbid History: [1:Chronic Obstructive Pulmonary Disease (COPD), Congestive Heart Failure, Hypertension, Osteoarthritis] [N/A:N/A] Date Acquired: [1:08/26/2015] [N/A:N/A] Weeks of Treatment: [1:5] [N/A:N/A] Wound Status: [1:Healed - Epithelialized] [N/A:N/A] Measurements L x W x D 0x0x0 [N/A:N/A] (cm) Area (cm) : [1:0] [N/A:N/A] Volume (cm) : [1:0] [N/A:N/A] %  Reduction in Area: [1:100.00%] [N/A:N/A] % Reduction in Volume: 100.00% [N/A:N/A] Classification: [1:Partial Thickness] [N/A:N/A] Exudate Amount: [1:None Present] [N/A:N/A] Wound Margin: [1:Distinct, outline attached] [N/A:N/A] Granulation Amount: [1:None Present (0%)] [N/A:N/A] Necrotic Amount: [1:None Present (0%)] [N/A:N/A] Exposed Structures: [1:Fascia: No Fat: No Tendon: No Muscle: No Joint: No Bone: No] [N/A:N/A] Limited to Skin Breakdown Epithelialization: Large (67-100%) N/A N/A Periwound Skin Texture: Edema: No N/A N/A Excoriation: No Induration: No Callus: No Crepitus: No Fluctuance: No Friable: No Rash: No Scarring: No Periwound Skin Dry/Scaly: Yes N/A N/A Moisture: Maceration: No Moist: No Periwound Skin Color: Atrophie Blanche: No N/A N/A Cyanosis: No Ecchymosis: No Erythema: No Hemosiderin Staining: No Mottled: No Pallor: No Rubor: No Temperature: No Abnormality N/A N/A Tenderness on No N/A N/A Palpation: Wound Preparation: Ulcer Cleansing: N/A N/A Rinsed/Irrigated with Saline Topical Anesthetic Applied: None Treatment Notes Electronic Signature(s) Signed: 10/07/2015 10:19:17 AM By: Elpidio Eric BSN, RN Entered By: Elpidio Eric on 10/07/2015 10:19:17 Lickteig, Alda R.  ([id number]) -------------------------------------------------------------------------------- Multi-Disciplinary Care Plan Details Patient Name: Marisa Schlein, Deena R. Date of Service: 10/07/2015 10:00 AM Medical Record Number: [id number] Patient Account Number: [id number] Date of Birth/Sex: 12/10/1923 (82 y.o. Female) Treating RN: Afful, RN, BSN, [place] Sink Primary Care Physician: Jodi Mourning Other Clinician: Referring Physician: Jodi Mourning Treating Physician/Extender: Rudene Re in Treatment: 5 Active Inactive Electronic Signature(s) Signed: 10/07/2015 10:19:07 AM By: Elpidio Eric BSN, RN Entered By: Elpidio Eric on 10/07/2015 10:19:06 Demby, Amel R. ([id number]) -------------------------------------------------------------------------------- Pain Assessment Details Patient Name: Bracknell, Arcadia R. Date of Service: 10/07/2015 10:00 AM Medical Record Number: [id number] Patient Account Number: [id number] Date of Birth/Sex: [birth year] (82 y.o. Female) Treating RN: Clover Mealy, RN, BSN, [place] Sink Primary Care Physician: Jodi Mourning Other Clinician: Referring Physician: Jodi Mourning Treating Physician/Extender: Rudene Re in Treatment: 5 Active Problems Location of Pain Severity and Description of Pain Patient Has Paino No Site Locations Pain Management and Medication Current Pain Management: Electronic Signature(s) Signed: 10/07/2015 3:20:24 PM By: Elpidio Eric BSN, RN Entered By: Elpidio Eric on 10/07/2015 10:06:37 Gordy Savers ([id number]) -------------------------------------------------------------------------------- Patient/Caregiver Education Details Patient Name: Marisa Schlein, Waylon R. Date of Service: 10/07/2015 10:00 AM Medical Record Number: [id number] Patient Account Number: [id number] Date of Birth/Gender: October 17, 1923 (82 y.o. Female) Treating RN: Clover Mealy, RN, BSN, [place] Sink Primary Care Physician: Jodi Mourning Other Clinician: Referring Physician: Jodi Mourning Treating Physician/Extender: Rudene Re in Treatment: 5 Education Assessment Education Provided To: Patient Education Topics Provided Wound/Skin Impairment: Methods: Explain/Verbal Responses: State content correctly Electronic Signature(s) Signed: 10/07/2015 3:20:24 PM By: Elpidio Eric BSN, RN Entered By: Elpidio Eric on 10/07/2015 10:21:16 Deitrick, Faduma R. ([id number]) -------------------------------------------------------------------------------- Wound Assessment Details Patient Name: Hourihan, Yatzari R. Date of Service: 10/07/2015 10:00 AM Medical Record Number: [id number] Patient Account Number: [id number] Date of Birth/Sex: [birth year] (82 y.o. Female) Treating RN: Afful, RN, BSN, [place] Sink Primary Care Physician: Jodi Mourning Other Clinician: Referring Physician: Jodi Mourning Treating Physician/Extender: Rudene Re in Treatment: 5 Wound Status Wound Number: 1 Primary Abscess Etiology: Wound Location: Right Gluteus Wound Healed - Epithelialized Wounding Event: Bump Status: Date Acquired: 08/26/2015 Comorbid Chronic Obstructive Pulmonary Disease Weeks Of Treatment: 5 History: (COPD), Congestive Heart Failure, Clustered Wound: No Hypertension, Osteoarthritis Photos Photo Uploaded By: Elpidio Eric on 10/07/2015 12:50:35 Wound Measurements Length: (cm) 0 % Reductio Width: (cm) 0 % Reductio Depth: (cm) 0 Epithelial Area: (cm) 0 Tunneling Volume: (cm) 0 Undermini n in Area: 100% n in Volume: 100% ization: Large (67-100%) : No ng: No Wound Description Classification: Partial  Thickness Wound Margin: Distinct, outline attached Exudate Amount: None Present Wound Bed Granulation Amount: None Present (0%) Exposed Structure Necrotic Amount: None Present (0%) Fascia Exposed: No Fat Layer Exposed: No Tendon Exposed: No Muscle Exposed: No Joint Exposed: No Mateo, Jizel R. ([id number]) Bone Exposed: No Limited to Skin Breakdown Periwound Skin  Texture Texture Color No Abnormalities Noted: No No Abnormalities Noted: No Callus: No Atrophie Blanche: No Crepitus: No Cyanosis: No Excoriation: No Ecchymosis: No Fluctuance: No Erythema: No Friable: No Hemosiderin Staining: No Induration: No Mottled: No Localized Edema: No Pallor: No Rash: No Rubor: No Scarring: No Temperature / Pain Moisture Temperature: No Abnormality No Abnormalities Noted: No Dry / Scaly: Yes Maceration: No Moist: No Wound Preparation Ulcer Cleansing: Rinsed/Irrigated with Saline Topical Anesthetic Applied: None Electronic Signature(s) Signed: 10/07/2015 3:20:24 PM By: Elpidio EricAfful, Rita BSN, RN Entered By: Elpidio EricAfful, Rita on 10/07/2015 10:07:27 Bagdasarian, Maral R. ([id number]) -------------------------------------------------------------------------------- Vitals Details Patient Name: Marisa SchleinWOOTEN, Miyonna R. Date of Service: 10/07/2015 10:00 AM Medical Record Number: [id number] Patient Account Number: [id number] Date of Birth/Sex: [birth year] (82 y.o. Female) Treating RN: Afful, RN, BSN, [place]ink[place] Primary Care Physician: Jodi MourningWalker III, John Other Clinician: Referring Physician: Jodi MourningWalker III, John Treating Physician/Extender: Rudene ReBritto, Errol Weeks in Treatment: 5 Vital Signs Time Taken: 10:06 Temperature (F): 97.6 Height (in): 60 Pulse (bpm): 76 Weight (lbs): 195 Respiratory Rate (breaths/min): 16 Body Mass Index (BMI): 38.1 Blood Pressure (mmHg): 149/67 Reference Range: 80 - 120 mg / dl Electronic Signature(s) Signed: 10/07/2015 3:20:24 PM By: Elpidio EricAfful, Rita BSN, RN Entered By: Elpidio EricAfful, Rita on 10/07/2015 10:06:54

## 2015-10-18 NOTE — Progress Notes (Signed)
Marisa, [name] ([id number]) Visit Report for 09/30/2015 Arrival Information Details Patient Name: Marisa [name], Marisa [name]. Date of Service: 09/30/2015 1:30 PM Medical Record Number: [id number] Patient Account Number: [id number] Date of Birth/Sex: Sep 29, 1923 (82 y.o. Female) Treating RN: Leonard Downing Primary Care Physician: Jodi Mourning Other Clinician: Referring Physician: Jodi Mourning Treating Physician/Extender: Elayne Snare in Treatment: 4 Visit Information History Since Last Visit All ordered tests and consults were completed: No Patient Arrived: Marisa [name] Added or deleted any medications: No Arrival Time: 13:39 Any new allergies or adverse reactions: No Accompanied By: daughters Had a fall or experienced change in No Transfer Assistance: None activities of daily living that may affect Patient Identification Verified: Yes risk of falls: Secondary Verification Process Yes Signs or symptoms of abuse/neglect since last No Completed: visito Patient Requires Transmission- No Hospitalized since last visit: No Based Precautions: Has Dressing in Place as Prescribed: Yes Patient Has Alerts: Yes Pain Present Now: No Patient Alerts: Patient on Blood Thinner Aspirin Electronic Signature(s) Signed: 09/30/2015 4:23:35 PM By: Lucrezia Starch, RN, Sendra Entered By: Lucrezia Starch RN, Sendra on 09/30/2015 13:40:23 Strnad, Naliyah R. ([id number]) -------------------------------------------------------------------------------- Clinic Level of Care Assessment Details Patient Name: [name], [name] Date of Service: 09/30/2015 1:30 PM Medical Record Number: [id number] Patient Account Number: [id number] Date of Birth/Sex: June 09, 1923 (82 y.o. Female) Treating RN: Leonard Downing Primary Care Physician: Jodi Mourning Other Clinician: Referring Physician: Jodi Mourning Treating Physician/Extender: Elayne Snare in Treatment: 4 Clinic Level of Care Assessment Items TOOL 4 Quantity  Score X - Use when only an EandM is performed on FOLLOW-UP visit 1 0 ASSESSMENTS - Nursing Assessment / Reassessment X - Reassessment of Co-morbidities (includes updates in patient status) 1 10 X - Reassessment of Adherence to Treatment Plan 1 5 ASSESSMENTS - Wound and Skin Assessment / Reassessment X - Simple Wound Assessment / Reassessment - one wound 1 5  - Complex Wound Assessment / Reassessment - multiple wounds 0  - Dermatologic / Skin Assessment (not related to wound area) 0 ASSESSMENTS - Focused Assessment  - Circumferential Edema Measurements - multi extremities 0  - Nutritional Assessment / Counseling / Intervention 0  - Lower Extremity Assessment (monofilament, tuning fork, pulses) 0  - Peripheral Arterial Disease Assessment (using hand held doppler) 0 ASSESSMENTS - Ostomy and/or Continence Assessment and Care  - Incontinence Assessment and Management 0  - Ostomy Care Assessment and Management (repouching, etc.) 0 PROCESS - Coordination of Care X - Simple Patient / Family Education for ongoing care 1 15  - Complex (extensive) Patient / Family Education for ongoing care 0 X - Staff obtains Chiropractor, Records, Test Results / Process Orders 1 10 X - Staff telephones HHA, Nursing Homes / Clarify orders / etc 1 10  - Routine Transfer to another Facility (non-emergent condition) 0 Reinard, Deshante R. ([id number])  - Routine Hospital Admission (non-emergent condition) 0  - New Admissions / Manufacturing engineer / Ordering NPWT, Apligraf, etc. 0  - Emergency Hospital Admission (emergent condition) 0 X - Simple Discharge Coordination 1 10  - Complex (extensive) Discharge Coordination 0 PROCESS - Special Needs  - Pediatric / Minor Patient Management 0  - Isolation Patient Management 0  - Hearing / Language / Visual special needs 0  - Assessment of Community assistance (transportation, D/C planning, etc.) 0  - Additional assistance / Altered  mentation 0  - Support Surface(s) Assessment (bed, cushion, seat, etc.) 0 INTERVENTIONS - Wound Cleansing / Measurement X - Simple Wound Cleansing - one wound 1  5 []  - Complex Wound Cleansing - multiple wounds 0 X - Wound Imaging (photographs - any number of wounds) 1 5 []  - Wound Tracing (instead of photographs) 0 X - Simple Wound Measurement - one wound 1 5 []  - Complex Wound Measurement - multiple wounds 0 INTERVENTIONS - Wound Dressings X - Small Wound Dressing one or multiple wounds 1 10 []  - Medium Wound Dressing one or multiple wounds 0 []  - Large Wound Dressing one or multiple wounds 0 []  - Application of Medications - topical 0 []  - Application of Medications - injection 0 INTERVENTIONS - Miscellaneous []  - External ear exam 0 Dobbins, Kennice R. ([id number]) []  - Specimen Collection (cultures, biopsies, blood, body fluids, etc.) 0 []  - Specimen(s) / Culture(s) sent or taken to Lab for analysis 0 []  - Patient Transfer (multiple staff / Michiel Sites Lift / Similar devices) 0 []  - Simple Staple / Suture removal (25 or less) 0 []  - Complex Staple / Suture removal (26 or more) 0 []  - Hypo / Hyperglycemic Management (close monitor of Blood Glucose) 0 []  - Ankle / Brachial Index (ABI) - do not check if billed separately 0 []  - Vital Signs 0 Has the patient been seen at the hospital within the last three years: Yes Total Score: 90 Level Of Care: New/Established - Level 3 Electronic Signature(s) Signed: 09/30/2015 4:23:35 PM By: Lucrezia Starch, RN, Sendra Entered By: Lucrezia Starch RN, Sendra on 09/30/2015 13:55:26 Gentles, Malaijah R. ([id number]) -------------------------------------------------------------------------------- Encounter Discharge Information Details Patient Name: Marisa Schlein, [name] Date of Service: 09/30/2015 1:30 PM Medical Record Patient Account Number: [id number] Number: Afful, RN, BSN, Treating RN: [birth year] (82 y.o. [place] Date of Birth/Sex: Female) Other  Clinician: Primary Care Physician: Lahoma Rocker, PETER Referring Physician: Jodi Mourning Physician/Extender: Tania Ade in Treatment: 4 Encounter Discharge Information Items Facility Notification Schedule Follow-up Appointment: No Facility Type: Home Health Medication Reconciliation completed Orders Sent: Yes and provided to Patient/Care No Shauni Henner: Provided on Clinical Summary of Care: 09/30/2015 Form Type Recipient Paper Patient GW Electronic Signature(s) Signed: 09/30/2015 4:23:35 PM By: Lucrezia Starch RN, Sendra Previous Signature: 09/30/2015 2:03:14 PM Version By: Ardath Sax MD Previous Signature: 09/30/2015 1:59:11 PM Version By: Ardath Sax MD Previous Signature: 09/30/2015 1:52:54 PM Version By: Gwenlyn Perking Entered By: Lucrezia Starch RN, Sendra on 09/30/2015 14:43:43 Shanholtzer, Amazing R. ([id number]) -------------------------------------------------------------------------------- Lower Extremity Assessment Details Patient Name: Steel, Janielle R. Date of Service: 09/30/2015 1:30 PM Medical Record Number: [id number] Patient Account Number: [id number] Date of Birth/Sex: 1923-12-19 (82 y.o. Female) Treating RN: Leonard Downing Primary Care Physician: Jodi Mourning Other Clinician: Referring Physician: Jodi Mourning Treating Physician/Extender: Elayne Snare in Treatment: 4 Electronic Signature(s) Signed: 09/30/2015 4:23:35 PM By: Lucrezia Starch RN, Sendra Entered By: Lucrezia Starch RN, Sendra on 09/30/2015 13:51:11 Roher, Emmalee R. ([id number]) -------------------------------------------------------------------------------- Pain Assessment Details Patient Name: Habeck, Tifini R. Date of Service: 09/30/2015 1:30 PM Medical Record Number: [id number] Patient Account Number: [id number] Date of Birth/Sex: [birth year] (82 y.o. Female) Treating RN: Leonard Downing Primary Care Physician: Jodi Mourning Other Clinician: Referring Physician: Jodi Mourning Treating  Physician/Extender: Elayne Snare in Treatment: 4 Active Problems Location of Pain Severity and Description of Pain Patient Has Paino Yes Site Locations Pain Location: Generalized Pain Pain Management and Medication Current Pain Management: Notes had a fall Electronic Signature(s) Signed: 09/30/2015 4:23:35 PM By: Lucrezia Starch, RN, Sendra Entered By: Lucrezia Starch RN, Sendra on 09/30/2015 13:40:44 Dishner, Basia R. ([id number]) -------------------------------------------------------------------------------- Patient/Caregiver Education Details Patient Name: Marisa Schlein, Shylynn R. Date of Service: 09/30/2015 1:30  PM Medical Record Patient Account Number: [id number] Number: Afful, RN, BSN, Treating RN: 01-14-1924 (82 y.o. [place] Sink Date of Birth/Gender: Female) Other Clinician: Primary Care Physician: Elwin Mocha Referring Physician: Jodi Mourning Physician/Extender: Tania Ade in Treatment: 4 Education Assessment Education Provided To: Caregiver Education Topics Provided Wound/Skin Impairment: Handouts: Caring for Your Ulcer, Skin Care Do's and Dont's Methods: Explain/Verbal Responses: State content correctly Electronic Signature(s) Signed: 09/30/2015 4:23:35 PM By: Lucrezia Starch, RN, Sendra Entered By: Lucrezia Starch RN, Sendra on 09/30/2015 14:43:28 Wissmann, Jamilett R. ([id number]) -------------------------------------------------------------------------------- Wound Assessment Details Patient Name: Barkdull, Lashante R. Date of Service: 09/30/2015 1:30 PM Medical Record Number: [id number] Patient Account Number: [id number] Date of Birth/Sex: 07-05-1923 (82 y.o. Female) Treating RN: Leonard Downing Primary Care Physician: Jodi Mourning Other Clinician: Referring Physician: Jodi Mourning Treating Physician/Extender: Elayne Snare in Treatment: 4 Wound Status Wound Number: 1 Primary Etiology: Abscess Wound Location: Right Gluteus Wound Status:  Open Wounding Event: Bump Date Acquired: 08/26/2015 Weeks Of Treatment: 4 Clustered Wound: No Photos Photo Uploaded By: Lucrezia Starch RN, Rosalio Macadamia on 09/30/2015 15:48:16 Wound Measurements Length: (cm) 0.5 Width: (cm) 0.5 Depth: (cm) 0.1 Area: (cm) 0.196 Volume: (cm) 0.02 % Reduction in Area: 58.4% % Reduction in Volume: 78.7% Wound Description Classification: Partial Thickness Periwound Skin Texture Texture Color No Abnormalities Noted: No No Abnormalities Noted: No Moisture No Abnormalities Noted: No Electronic Signature(s) Signed: 09/30/2015 4:23:35 PM By: Lucrezia Starch RN, Marnee Guarneri, Arlicia R. ([id number]) Entered By: Maureen Chatters on 09/30/2015 13:47:46

## 2015-10-18 NOTE — Progress Notes (Signed)
Marisa [name] ([id number]) Visit Report for 09/30/2015 Chief Complaint Document Details Patient Name: Marisa [name], [name] Date of Service: 09/30/2015 1:30 PM Medical Record Patient Account Number: [id number] Number: Afful, RN, BSN, Treating RN: 11-15-1923 (82 y.o. [place] Sink Date of Birth/Sex: Female) Other Clinician: Primary Care Physician: Kennieth Francois Referring Physician: Jodi Mourning Physician/Extender: Tania Ade in Treatment: 4 Information Obtained from: Patient Chief Complaint Patient presents to the wound care center for a consult due non healing wound to the right gluteal area which she's had for about 3 weeks. Electronic Signature(s) Signed: 09/30/2015 1:59:57 PM By: Ardath Sax MD Signed: 10/17/2015 4:15:58 PM By: Evlyn Kanner MD, FACS Previous Signature: 09/30/2015 1:23:28 PM Version By: Ardath Sax MD Entered By: Ardath Sax on 09/30/2015 13:59:57 Marisa [name], [name] ([id number]) -------------------------------------------------------------------------------- HPI Details Patient Name: Marisa [name], [name] Date of Service: 09/30/2015 1:30 PM Medical Record Patient Account Number: [id number] Number: Afful, RN, BSN, Treating RN: 1924/01/09 (82 y.o. [place] Sink Date of Birth/Sex: Female) Other Clinician: Primary Care Physician: Kennieth Francois Referring Physician: Jodi Mourning Physician/Extender: Tania Ade in Treatment: 4 History of Present Illness Location: right gluteal area ulcer Quality: Patient reports experiencing a dull pain to affected area(s). Severity: Patient states wound are getting worse. Duration: Patient has had the wound for < 3 weeks prior to presenting for treatment Timing: Pain in wound is Intermittent (comes and goes Context: The wound would happen gradually Modifying Factors: Other treatment(s) tried include:Diflucan and some Lotrisone cream Associated Signs and Symptoms: Patient reports  having difficulty sitting for long periods. HPI Description: 82 year old patient who was seen by the PA at the internal medicine practice [facility]. The patient has been referred to others with what looks like a fungal infection in the perineal area with some open wound which was referred for an opinion. The patient's past medical history significant for hiatal hernia, osteoarthritis, status post appendectomy, cholecystectomy, knee arthroscopy. The patient is not been a smoker. The patient was put on Diflucan 100 mg daily for 3 days and was asked to use Lotrisone cream twice daily. Moisture barrier was also recommended and off loading was suggested. the ulcerated areas on the right gluteal region and started off as a bump and then opened out into an ulcer. Electronic Signature(s) Signed: 09/30/2015 2:00:10 PM By: Ardath Sax MD Signed: 10/17/2015 4:15:58 PM By: Evlyn Kanner MD, FACS Previous Signature: 09/30/2015 1:23:55 PM Version By: Ardath Sax MD Entered By: Ardath Sax on 09/30/2015 14:00:10 Marisa [name], [name] -------------------------------------------------------------------------------- Physical Exam Details Patient Name: [name], [name] Date of Service: 09/30/2015 1:30 PM Medical Record Patient Account Number: [id number] Number: Afful, RN, BSN, Treating RN: 20-Jan-1924 (82 y.o. [place] Sink Date of Birth/Sex: Female) Other Clinician: Primary Care Physician: Kennieth Francois Referring Physician: Jodi Mourning Physician/Extender: Tania Ade in Treatment: 4 Electronic Signature(s) Signed: 09/30/2015 2:00:24 PM By: Ardath Sax MD Signed: 10/17/2015 4:15:58 PM By: Evlyn Kanner MD, FACS Previous Signature: 09/30/2015 1:25:06 PM Version By: Ardath Sax MD Entered By: Ardath Sax on 09/30/2015 14:00:23 Marisa [name], [name] -------------------------------------------------------------------------------- Physician Orders  Details Patient Name: Marisa [name], [name] Date of Service: 09/30/2015 1:30 PM Medical Record Number: [id number] Patient Account Number: [id number] Date of Birth/Sex: 01-Jul-1923 (82 y.o. Female) Treating RN: Leonard Downing Primary Care Physician: Jodi Mourning Other Clinician: Referring Physician: Jodi Mourning Treating Physician/Extender: Elayne Snare in Treatment: 4 Verbal / Phone Orders: Yes Clinician: Leonard Downing Read Back and Verified: Yes Diagnosis Coding  ICD-10 Coding Code Description S31.819A Unspecified open wound of right buttock, initial encounter L02.32 Furuncle of buttock Wound Cleansing Wound #1 Right Gluteus o Clean wound with Normal Saline. Anesthetic Wound #1 Right Gluteus o Topical Lidocaine 4% cream applied to wound bed prior to debridement Primary Wound Dressing Wound #1 Right Gluteus o Aquacel Ag Secondary Dressing Wound #1 Right Gluteus o Boardered Foam Dressing Dressing Change Frequency Wound #1 Right Gluteus o Change Dressing Monday, Wednesday, Friday - Monday in wound care center Follow-up Appointments Wound #1 Right Gluteus o Return Appointment in 1 week. Off-Loading Wound #1 Right Gluteus o Turn and reposition every 2 hours Additional Orders / Instructions Marisa [name], [name] ([id number]) Wound #1 Right Gluteus o Increase protein intake. o Other: - MVI, ZINC, Vitamin C Home Health Wound #1 Right Gluteus o Continue Home Health Visits o Home Health Nurse may visit PRN to address patientos wound care needs. o FACE TO FACE ENCOUNTER: MEDICARE and MEDICAID PATIENTS: I certify that this patient is under my care and that I had a face-to-face encounter that meets the physician face-to-face encounter requirements with this patient on this date. The encounter with the patient was in whole or in part for the following MEDICAL CONDITION: (primary reason for Home Healthcare) MEDICAL NECESSITY: I certify, that based on  my findings, NURSING services are a medically necessary home health service. HOME BOUND STATUS: I certify that my clinical findings support that this patient is homebound (i.e., Due to illness or injury, pt requires aid of supportive devices such as crutches, cane, wheelchairs, walkers, the use of special transportation or the assistance of another person to leave their place of residence. There is a normal inability to leave the home and doing so requires considerable and taxing effort. Other absences are for medical reasons / religious services and are infrequent or of short duration when for other reasons). o If current dressing causes regression in wound condition, may D/C ordered dressing product/s and apply Normal Saline Moist Dressing daily until next Wound Healing Center / Other MD appointment. Notify Wound Healing Center of regression in wound condition at [phone]. Electronic Signature(s) Signed: 09/30/2015 4:23:35 PM By: Maureen Chatters Signed: 10/01/2015 1:16:39 PM By: Ardath Sax MD Entered By: Lucrezia Starch RN, Sendra on 09/30/2015 13:51:49 Marisa [name], [name] ([id number]) -------------------------------------------------------------------------------- Problem List Details Patient Name: Marisa [name], [name] Date of Service: 09/30/2015 1:30 PM Medical Record Patient Account Number: [id number] Number: Afful, RN, BSN, Treating RN: 04-05-1923 (82 y.o. [place] Sink Date of Birth/Sex: Female) Other Clinician: Primary Care Physician: Kennieth Francois Referring Physician: Jodi Mourning Physician/Extender: Tania Ade in Treatment: 4 Active Problems ICD-10 Encounter Code Description Active Date Diagnosis S31.819A Unspecified open wound of right buttock, initial encounter 09/02/2015 Yes L02.32 Furuncle of buttock 09/02/2015 Yes Inactive Problems Resolved Problems Electronic Signature(s) Signed: 09/30/2015 1:59:45 PM By: Ardath Sax MD Signed: 10/17/2015  4:15:58 PM By: Evlyn Kanner MD, FACS Previous Signature: 09/30/2015 1:23:16 PM Version By: Ardath Sax MD Entered By: Ardath Sax on 09/30/2015 13:59:45 Marisa [name], [name] ([id number]) -------------------------------------------------------------------------------- Progress Note Details Patient Name: Marisa [name], [name] Date of Service: 09/30/2015 1:30 PM Medical Record Patient Account Number: [id number] Number: Afful, RN, BSN, Treating RN: 1923/05/20 (82 y.o. [place] Sink Date of Birth/Sex: Female) Other Clinician: Primary Care Physician: Kennieth Francois Referring Physician: Jodi Mourning Physician/Extender: Tania Ade in Treatment: 4 Subjective Chief Complaint Information obtained from Patient Patient presents to the wound care center for a consult due non healing wound to the  right gluteal area which she's had for about 3 weeks. History of Present Illness (HPI) The following HPI elements were documented for the patient's wound: Location: right gluteal area ulcer Quality: Patient reports experiencing a dull pain to affected area(s). Severity: Patient states wound are getting worse. Duration: Patient has had the wound for < 3 weeks prior to presenting for treatment Timing: Pain in wound is Intermittent (comes and goes Context: The wound would happen gradually Modifying Factors: Other treatment(s) tried include:Diflucan and some Lotrisone cream Associated Signs and Symptoms: Patient reports having difficulty sitting for long periods. 82 year old patient who was seen by the PA at the internal medicine practice [facility]. The patient has been referred to others with what looks like a fungal infection in the perineal area with some open wound which was referred for an opinion. The patient's past medical history significant for hiatal hernia, osteoarthritis, status post appendectomy, cholecystectomy, knee arthroscopy. The patient is not been a smoker. The  patient was put on Diflucan 100 mg daily for 3 days and was asked to use Lotrisone cream twice daily. Moisture barrier was also recommended and off loading was suggested. the ulcerated areas on the right gluteal region and started off as a bump and then opened out into an ulcer. Assessment Active Problems ICD-10 S31.819A - Unspecified open wound of right buttock, initial encounter L02.32 - Furuncle of buttock Marisa [name], [name] ([id number]) Wound almost healed. Continue alginate dressings and offloading Electronic Signature(s) Signed: 09/30/2015 2:02:03 PM By: Ardath Sax MD Signed: 10/17/2015 4:15:58 PM By: Evlyn Kanner MD, FACS Previous Signature: 09/30/2015 1:36:22 PM Version By: Ardath Sax MD Entered By: Ardath Sax on 09/30/2015 14:02:02 Marisa [name], [name] ([id number]) -------------------------------------------------------------------------------- SuperBill Details Patient Name: Marisa [name], Dorianne R. Date of Service: 09/30/2015 Medical Record Patient Account Number: [id number] Number: Afful, RN, BSN, Treating RN: [birth year] (82 y.o. [place] Sink Date of Birth/Sex: Female) Other Clinician: Primary Care Physician: Kennieth Francois Referring Physician: Jodi Mourning Physician/Extender: Tania Ade in Treatment: 4 Diagnosis Coding ICD-10 Codes Code Description [id number] Unspecified open wound of right buttock, initial encounter L02.32 Furuncle of buttock Facility Procedures CPT4 Code: 13086578 Description: 99213 - WOUND CARE VISIT-LEV 3 EST PT Modifier: Quantity: 1 Physician Procedures CPT4 Code: 4696295 Description: 28413 - WC PHYS LEVEL 2 - EST PT ICD-10 Description Diagnosis L02.32 Furuncle of buttock Modifier: Quantity: 1 Electronic Signature(s) Signed: 09/30/2015 4:23:35 PM By: Maureen Chatters Signed: 10/17/2015 4:15:58 PM By: Evlyn Kanner MD, FACS Previous Signature: 09/30/2015 2:02:40 PM Version By: Ardath Sax MD Previous Signature:  09/30/2015 1:58:14 PM Version By: Ardath Sax MD Entered By: Lucrezia Starch RN, Sendra on 09/30/2015 15:28:46

## 2015-11-06 ENCOUNTER — Ambulatory Visit: Payer: Medicare Other | Attending: Pain Medicine | Admitting: Pain Medicine

## 2015-11-06 ENCOUNTER — Encounter: Payer: Self-pay | Admitting: Pain Medicine

## 2015-11-06 VITALS — BP 130/49 | HR 65 | Temp 97.6°F | Resp 16

## 2015-11-06 DIAGNOSIS — X58XXXA Exposure to other specified factors, initial encounter: Secondary | ICD-10-CM | POA: Diagnosis not present

## 2015-11-06 DIAGNOSIS — M4806 Spinal stenosis, lumbar region: Secondary | ICD-10-CM | POA: Insufficient documentation

## 2015-11-06 DIAGNOSIS — M19019 Primary osteoarthritis, unspecified shoulder: Secondary | ICD-10-CM | POA: Insufficient documentation

## 2015-11-06 DIAGNOSIS — S46919A Strain of unspecified muscle, fascia and tendon at shoulder and upper arm level, unspecified arm, initial encounter: Secondary | ICD-10-CM | POA: Insufficient documentation

## 2015-11-06 DIAGNOSIS — M47816 Spondylosis without myelopathy or radiculopathy, lumbar region: Secondary | ICD-10-CM | POA: Insufficient documentation

## 2015-11-06 DIAGNOSIS — M79606 Pain in leg, unspecified: Secondary | ICD-10-CM | POA: Diagnosis present

## 2015-11-06 DIAGNOSIS — M533 Sacrococcygeal disorders, not elsewhere classified: Secondary | ICD-10-CM

## 2015-11-06 DIAGNOSIS — S46001D Unspecified injury of muscle(s) and tendon(s) of the rotator cuff of right shoulder, subsequent encounter: Secondary | ICD-10-CM

## 2015-11-06 DIAGNOSIS — M62830 Muscle spasm of back: Secondary | ICD-10-CM | POA: Insufficient documentation

## 2015-11-06 DIAGNOSIS — M170 Bilateral primary osteoarthritis of knee: Secondary | ICD-10-CM | POA: Diagnosis not present

## 2015-11-06 DIAGNOSIS — S46119A Strain of muscle, fascia and tendon of long head of biceps, unspecified arm, initial encounter: Secondary | ICD-10-CM | POA: Diagnosis not present

## 2015-11-06 DIAGNOSIS — M546 Pain in thoracic spine: Secondary | ICD-10-CM | POA: Diagnosis present

## 2015-11-06 DIAGNOSIS — M7060 Trochanteric bursitis, unspecified hip: Secondary | ICD-10-CM | POA: Insufficient documentation

## 2015-11-06 DIAGNOSIS — M19011 Primary osteoarthritis, right shoulder: Secondary | ICD-10-CM | POA: Diagnosis not present

## 2015-11-06 DIAGNOSIS — M5136 Other intervertebral disc degeneration, lumbar region: Secondary | ICD-10-CM

## 2015-11-06 DIAGNOSIS — M19012 Primary osteoarthritis, left shoulder: Secondary | ICD-10-CM

## 2015-11-06 DIAGNOSIS — M545 Low back pain: Secondary | ICD-10-CM | POA: Diagnosis present

## 2015-11-06 MED ORDER — HYDROCODONE-ACETAMINOPHEN 5-325 MG PO TABS: ORAL_TABLET | ORAL | 0 refills | Status: DC

## 2015-11-06 MED ORDER — TRAMADOL HCL 50 MG PO TABS: ORAL_TABLET | ORAL | 0 refills | Status: DC

## 2015-11-06 NOTE — Patient Instructions (Addendum)
PLAN   Continue present medications  tramadol and hydrocodone acetaminophen Continue Voltaren gel applications. May apply to the right lower extremity and to shoulder as we previously discussed  F/U PCP Dr. Yates DecampJohn Walker [name]  for evaliation of  BP lesion of the right gluteal region and to watch for any swelling, erythema, or increased pain of the left lower extremity as well as to follow-up of general medical  condition.   F/U surgical evaluation. May consider further evaluation of lumbar and lower extremity pain pending follow-up evaluations .Patient has undergone prior surgical evaluation and without plans for surgical intervention due to general medical condition. Follow-up with Dr.[name] and with [name] regarding shoulder as previously discussed   F/U Dr. Joice LoftsPoggi for follow-up evaluation of right shoulder    F/U neurological evaluation. May consider pending follow-up evaluations We will avoid [facility] EMG studies and such studies at this time  Wound care clinic follow-up evaluation. Please follow-up with wound care clinic for evaluation of lesion of the right gluteal region as you're presently doing  May consider radiofrequency rhizolysis or intraspinal procedures pending response to present treatment and F/U evaluation  We will avoid such treatment at this time  Patient to call [facility] should patient have concerns prior to scheduled return appointment Trigger Point Injection Trigger points are areas where you have muscle pain. A trigger point injection is a shot given in the trigger point to relieve that pain. A trigger point might feel like a knot in your muscle. It hurts to press on a trigger point. Sometimes the pain spreads out (radiates) to other parts of the body. For example, pressing on a trigger point in your shoulder might cause pain in your arm or neck. You might have one trigger point. Or, you might have more than one. People often have trigger points in their upper back  and lower back. They also occur often in the neck and shoulders. Pain from a trigger point lasts for a long time. It can make it hard to keep moving. You might not be able to do the exercise or physical therapy that could help you deal with the pain. A trigger point injection may help. It does not work for everyone. But, it may relieve your pain for a few days or a few months. A trigger point injection does not cure long-lasting (chronic) pain. LET YOUR CAREGIVER KNOW ABOUT:  Any allergies (especially to latex, lidocaine, or steroids).  Blood-thinning medicines that you take. These drugs can lead to bleeding or bruising after an injection. They include:  Aspirin.  Ibuprofen.  Clopidogrel.  Warfarin.  Other medicines you take. This includes all vitamins, herbs, eyedrops, over-the-counter medicines, and creams.  Use of steroids.  Recent infections.  Past problems with numbing medicines.  Bleeding problems.  Surgeries you have had.  Other health problems. RISKS AND COMPLICATIONS A trigger point injection is a safe treatment. However, problems may develop, such as:  Minor side effects usually go away in 1 to 2 days. These may include:  Soreness.  Bruising.  Stiffness.  More serious problems are rare. But, they may include:  Bleeding under the skin (hematoma).  Skin infection.  Breaking off of the needle under your skin.  Lung puncture.  The trigger point injection may not work for you. BEFORE THE PROCEDURE You may need to stop taking any medicine that thins your blood. This is to prevent bleeding and bruising. Usually these medicines are stopped several days before the injection. No other  preparation is needed. PROCEDURE  A trigger point injection can be given in your caregiver's office or in a clinic. Each injection takes 2 minutes or less.  Your caregiver will feel for trigger points. The caregiver may use a marker to circle the area for the injection.  The  skin over the trigger point will be washed with a germ-killing (antiseptic) solution.  The caregiver pinches the spot for the injection.  Then, a very thin needle is used for the shot. You may feel pain or a twitching feeling when the needle enters the trigger point.  A numbing solution may be injected into the trigger point. Sometimes a drug to keep down swelling, redness, and warmth (inflammation) is also injected.  Your caregiver moves the needle around the trigger zone until the tightness and twitching goes away.  After the injection, your caregiver may put gentle pressure over the injection site.  Then it is covered with a bandage. AFTER THE PROCEDURE  You can go right home after the injection.  The bandage can be taken off after a few hours.  You may feel sore and stiff for 1 to 2 days.  Go back to your regular activities slowly. Your caregiver may ask you to stretch your muscles. Do not do anything that takes extra energy for a few days.  Follow your caregiver's instructions to manage and treat other pain.   This information is not intended to replace advice given to you by your health care provider. Make sure you discuss any questions you have with your health care provider.   Document Released: 02/26/2011 Document Revised: 07/04/2012 Document Reviewed: 02/26/2011 Elsevier Interactive Patient Education [employer]2016 [employer]

## 2015-11-06 NOTE — Progress Notes (Signed)
Safety precautions to be maintained throughout the outpatient stay will include: orient to surroundings, keep bed in low position, maintain call bell within reach at all times, provide assistance with transfer out of bed and ambulation.  

## 2015-11-06 NOTE — Progress Notes (Signed)
The patient is a 82 year old female who returns to pain management for further evaluation and treatment of pain involving the shoulder upper mid lower back and lower extremity region. The patient also is with history of headaches with pain radiating from the back of neck to the back of the head and continuing toward the retro-orbital region. The patient is without complaint of headache at this time. The patient is with pain which is involving the region of the shoulder especially the right shoulder and is undergone injection of the shoulder as well as the pediatrics evaluation of the shoulder by Dr. [name] . The patient denies any trauma change in events of daily living the call significant change in patient's condition. The patient was accompanied by her daughter on today's visit we discussed patient's condition including activities of daily living. The patient has modified activities of daily living and has assistive devices to assist with performing activities of daily living. The patient also has age to come in to assist with activities of daily living and states that she is able to manage without experiencing significant pain throughout the day. We discussed patient's condition and we will continue tramadol and hydrocodone acetaminophen at this time The patient is without drowsiness confusion and other unresolved side effects. We will revise patient to avoid sitting in chairs which cause her to rub the sides of her lower extremities which aggravates her greater trochanteric bursitis. We have also discussed other modifications of activities of daily living and patient will attempt to adhere to modifications of body mechanics. We will remain available to consider interventional treatment as well as additional modifications of treatment pending follow-up evaluation. All agreed to suggested treatment plan    Physical examination  Palpation of the splenius capitis and occipitalis region reproduced mild  discomfort with mild tenderness of the cervical and thoracic facet region. No masses of the head and neck were noted. There was moderate tenderness to moderately severe tenderness to palpation of the acromioclavicular and glenohumeral joint region on the right compared to the left. The patient was with slightly decreased grip strength with mild to moderate increase of pain with Tinel and Phalen's maneuver. There was no increased warmth and erythema in the region of the upper extremities. Palpation over the thoracic region was attends to palpation and evidence of muscle spasm of moderate degree with no crepitus of the thoracic region noted. Palpation over the lumbar region was with increased pain with palpation over the lumbar facet region. Palpation over the PSIS and PII S regions reproduce moderate to moderately severe discomfort as well with moderate tends to palpation of the greater trochanteric region iliotibial band region. Straight leg raise was limited to 20 without increased pain with dorsiflexion noted. Superficial varicosities of the lower extremities were noted with no increased warmth erythema of the lower extremities noted. EHL strength appeared to be decreased with negative clonus negative Homans. 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 Edema in the infraspinatus muscle belly is likely due to subacute injury given history of trauma. Denervation atrophy is thought less likely. All imaged musculature demonstrates some fatty replacement.  Complete tear of the long head of biceps from the superior labrum.  Bulky acromioclavicular osteoarthritis. Glenohumeral degenerative change is also seen.ive  disc disease lumbar  spine Multilevel degenerative changes lumbar spine lumbar stenosis multiple levels L3-4 L4-5 L1-2 and L2-3. Lumbar facet degenerative changes, foraminal narrowing, ligamentum flavum hypertrophy  Lumbar stenosis with with neurogenic claudication  Lumbar facet syndrome  Greater trochanteric bursitis  Degenerative joint disease of knees    PLAN   Continue present medications  tramadol and hydrocodone acetaminophen Continue Voltaren gel applications. May apply to the right lower extremity and to shoulder as we previously discussed  We will consider greater trochanteric bursa injection as well as cluneal and sciatic nerve block. Patient wishes to proceed with such and pending follow-up evaluation  F/U PCP Marisa [name]  for evaliation of  BP lesion of the right gluteal region and to watch for any swelling, erythema, or increased pain of the left lower extremity as well as to follow-up of general medical  condition.   F/U surgical evaluation. May consider further evaluation of lumbar and lower extremity pain pending follow-up evaluations .Patient has undergone prior surgical evaluation and without plans for surgical intervention due to general medical condition. Follow-up with Marisa [name] and with Marisa [name] regarding shoulder as previously discussed   F/U Dr. [name] for follow-up evaluation of right shoulder    F/U neurological evaluation. May consider pending follow-up evaluations We will avoid Marisa [name] studies and such studies at this time  Wound care clinic follow-up evaluation. Please follow-up with wound care clinic for evaluation of lesion of the right gluteal region as you're presently doing  May consider radiofrequency rhizolysis or intraspinal procedures pending response to present treatment and F/U evaluation  We will avoid such treatment at this time  Patient to call Pain Management Center should patient have concerns prior to scheduled return appointment

## 2015-12-02 ENCOUNTER — Encounter: Payer: Self-pay | Admitting: Pain Medicine

## 2015-12-02 ENCOUNTER — Ambulatory Visit: Payer: Medicare Other | Attending: Pain Medicine | Admitting: Pain Medicine

## 2015-12-02 VITALS — BP 173/66 | HR 76 | Temp 97.8°F | Resp 16 | Ht 63.0 in | Wt 192.0 lb

## 2015-12-02 DIAGNOSIS — M170 Bilateral primary osteoarthritis of knee: Secondary | ICD-10-CM | POA: Diagnosis not present

## 2015-12-02 DIAGNOSIS — M4806 Spinal stenosis, lumbar region: Secondary | ICD-10-CM | POA: Insufficient documentation

## 2015-12-02 DIAGNOSIS — Z87828 Personal history of other (healed) physical injury and trauma: Secondary | ICD-10-CM | POA: Diagnosis not present

## 2015-12-02 DIAGNOSIS — S46001D Unspecified injury of muscle(s) and tendon(s) of the rotator cuff of right shoulder, subsequent encounter: Secondary | ICD-10-CM

## 2015-12-02 DIAGNOSIS — M47816 Spondylosis without myelopathy or radiculopathy, lumbar region: Secondary | ICD-10-CM | POA: Insufficient documentation

## 2015-12-02 DIAGNOSIS — S43499A Other sprain of unspecified shoulder joint, initial encounter: Secondary | ICD-10-CM | POA: Diagnosis not present

## 2015-12-02 DIAGNOSIS — M7060 Trochanteric bursitis, unspecified hip: Secondary | ICD-10-CM | POA: Diagnosis not present

## 2015-12-02 DIAGNOSIS — M19011 Primary osteoarthritis, right shoulder: Secondary | ICD-10-CM | POA: Insufficient documentation

## 2015-12-02 DIAGNOSIS — M533 Sacrococcygeal disorders, not elsewhere classified: Secondary | ICD-10-CM

## 2015-12-02 DIAGNOSIS — M19019 Primary osteoarthritis, unspecified shoulder: Secondary | ICD-10-CM | POA: Diagnosis not present

## 2015-12-02 DIAGNOSIS — X58XXXA Exposure to other specified factors, initial encounter: Secondary | ICD-10-CM | POA: Insufficient documentation

## 2015-12-02 DIAGNOSIS — S46919A Strain of unspecified muscle, fascia and tendon at shoulder and upper arm level, unspecified arm, initial encounter: Secondary | ICD-10-CM | POA: Insufficient documentation

## 2015-12-02 DIAGNOSIS — G5600 Carpal tunnel syndrome, unspecified upper limb: Secondary | ICD-10-CM

## 2015-12-02 DIAGNOSIS — M19012 Primary osteoarthritis, left shoulder: Secondary | ICD-10-CM

## 2015-12-02 DIAGNOSIS — M48062 Spinal stenosis, lumbar region with neurogenic claudication: Secondary | ICD-10-CM

## 2015-12-02 DIAGNOSIS — M5136 Other intervertebral disc degeneration, lumbar region: Secondary | ICD-10-CM

## 2015-12-02 DIAGNOSIS — M25511 Pain in right shoulder: Secondary | ICD-10-CM | POA: Diagnosis present

## 2015-12-02 MED ORDER — TRAMADOL HCL 50 MG PO TABS: ORAL_TABLET | ORAL | 0 refills | Status: AC

## 2015-12-02 MED ORDER — HYDROCODONE-ACETAMINOPHEN 5-325 MG PO TABS: ORAL_TABLET | ORAL | 0 refills | Status: AC

## 2015-12-02 NOTE — Patient Instructions (Addendum)
PLAN   Continue present medications  tramadol and hydrocodone acetaminophen Continue Voltaren gel applications. May apply to the right lower extremity and to shoulder as we previously discussed  F/U PCP Dr. Yates DecampJohn Walker III  for evaliation of  BP lesion of the right gluteal region and to watch for any swelling, erythema, or increased pain of the left lower extremity as well as to follow-up of general medical  Condition as we previously discussed   F/U surgical evaluation. May consider further evaluation of lumbar and lower extremity pain pending follow-up evaluations .Patient has undergone prior surgical evaluation and without plans for surgical intervention due to general medical condition. Follow-up with Dr.[name] and with [name] regarding shoulder as previously discussed   F/U Dr. Joice LoftsPoggi for follow-up evaluation of right shoulder    F/U neurological evaluation. May consider pending follow-up evaluations We will avoid [facility] EMG studies and such studies at this time  Wound care clinic follow-up evaluation. Please follow-up with wound care clinic for evaluation of lesion of the right gluteal region as you're presently doing  May consider radiofrequency rhizolysis or intraspinal procedures pending response to present treatment and F/U evaluation  We will avoid such treatment at this time  Patient to call [facility] Management Center should patient have concerns prior to scheduled return appointment

## 2015-12-02 NOTE — Progress Notes (Signed)
Patient here for medication management Safety precautions to be maintained throughout the outpatient stay will include: orient to surroundings, keep bed in low position, maintain call bell within reach at all times, provide assistance with transfer out of bed and ambulation.  

## 2015-12-03 NOTE — Progress Notes (Signed)
Patient is a 82 year old female who returns to pain management for further evaluation and treatment of pain. The patient has had pain involving the right shoulder as well as lower back lower extremity region with occasional pain radiating from the neck to the back of the head causing headaches. The lower back lower extremity pain radiates to the right lower extremity region predominantly. The patient states the pain is fairly well-controlled and that she is able to perform activities around the house without severely disabling pain. The patient has limited her activities will avoid exacerbation of her symptoms and to avoid dropping objects or following. We will continue medications as prescribed and treatment including tramadol and hydrocodone acetaminophen which patient is tolerating very well without undesirable side effects. We will consider additional modifications of treatment pending further assessment of condition. All agreed to suggested treatment plan.     Physical examination  There was mild tenderness of the splenius capitis and a separate talus region as well as the cervical facet and thoracic facet region. There was unremarkable Spurling's maneuver. Palpation of the acromioclavicular and glenohumeral joint regions reproduced pain of moderate degree. There appeared to be bilaterally equal grip strength without increased pain with Tinel and Phalen's maneuver with left well-healed surgical scars of the right upper extremity. Palpation over the thoracic region thoracic facet region was with tenderness to palpation of mild degree with moderate tenderness over the lumbar facet lumbar paraspinal musculature region as well as the PSIS and PI is regions. There was straight leg raising tolerates approximately 20 without increased pain with dorsiflexion noted superficial varicosities of the lower extremity were noted with well-healed surgical scar of the knee without increased warmth or erythema  in the lower extremity region there was negative clonus negative Homans. No increased warmth and erythema in the lower extremities noted. EHL strength appeared to be slightly decreased. No separate sensory deficit or dermatomal distribution detected. 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 Edema in the infraspinatus muscle belly is likely due to subacute injury given history of trauma. Denervation atrophy is thought less likely. All imaged musculature demonstrates some fatty replacement.  Complete tear of the long head of biceps from the superior labrum.  Bulky acromioclavicular osteoarthritis. Glenohumeral degenerative change is also seen.ive disc disease lumbar spine Multilevel degenerative changes lumbar spine lumbar stenosis multiple levels L3-4 L4-5 L1-2 and L2-3. Lumbar facet degenerative changes, foraminal narrowing, ligamentum flavum hypertrophy  Lumbar stenosis with with neurogenic claudication  Lumbar facet syndrome  Greater trochanteric bursitis  Degenerative joint disease of knees       PLAN   Continue present medications  tramadol and hydrocodone acetaminophen Continue Voltaren gel applications. May apply to the right lower extremity and to shoulder as we previously discussed  F/U PCP Dr. Yates DecampJohn Walker III  for evaliation of  BP lesion of the right gluteal region and to watch for any swelling, erythema, or increased pain of the left lower extremity as well as to follow-up of general medical  Condition as we previously discussed   F/U surgical evaluation. May consider further evaluation of lumbar and lower extremity pain pending follow-up evaluations .Patient has undergone prior surgical evaluation and without plans for  surgical intervention due to general medical condition. Follow-up with Dr.[name] and with Rob  Tumey regarding shoulder as previously discussed   F/U Dr. Joice Lofts for follow-up evaluation of right shoulder    F/U neurological evaluation. May consider pending follow-up evaluations We will avoid [facility] EMG studies and such studies at this time  Wound care clinic follow-up evaluation. Please follow-up with wound care clinic for evaluation of lesion of the right gluteal region as you're presently doing  May consider radiofrequency rhizolysis or intraspinal procedures pending response to present treatment and F/U evaluation  We will avoid such treatment at this time  Patient to call Pain Management Center should patient have concerns prior to scheduled return appointment

## 2016-02-08 ENCOUNTER — Other Ambulatory Visit: Payer: Self-pay | Admitting: Pain Medicine

## 2016-06-25 ENCOUNTER — Emergency Department
Admission: EM | Admit: 2016-06-25 | Discharge: 2016-06-25 | Disposition: A | Discharge status: Home / Self Care | Payer: Medicare Other | Attending: Emergency Medicine | Admitting: Emergency Medicine

## 2016-06-25 ENCOUNTER — Emergency Department: Payer: Medicare Other

## 2016-06-25 ENCOUNTER — Encounter: Payer: Self-pay | Admitting: Emergency Medicine

## 2016-06-25 DIAGNOSIS — I509 Heart failure, unspecified: Secondary | ICD-10-CM | POA: Insufficient documentation

## 2016-06-25 DIAGNOSIS — Z79899 Other long term (current) drug therapy: Secondary | ICD-10-CM | POA: Diagnosis not present

## 2016-06-25 DIAGNOSIS — I110 Hypertensive heart disease with heart failure: Secondary | ICD-10-CM | POA: Diagnosis not present

## 2016-06-25 DIAGNOSIS — R531 Weakness: Secondary | ICD-10-CM | POA: Diagnosis present

## 2016-06-25 DIAGNOSIS — N390 Urinary tract infection, site not specified: Secondary | ICD-10-CM | POA: Diagnosis not present

## 2016-06-25 DIAGNOSIS — Z7982 Long term (current) use of aspirin: Secondary | ICD-10-CM | POA: Insufficient documentation

## 2016-06-25 LAB — URINALYSIS, COMPLETE (UACMP) WITH MICROSCOPIC
BILIRUBIN URINE: NEGATIVE
GLUCOSE, UA: NEGATIVE mg/dL
HGB URINE DIPSTICK: NEGATIVE
Ketones, ur: NEGATIVE mg/dL
NITRITE: NEGATIVE
PH: 5 (ref 5.0–8.0)
Protein, ur: NEGATIVE mg/dL
SPECIFIC GRAVITY, URINE: 1.013 (ref 1.005–1.030)

## 2016-06-25 LAB — COMPREHENSIVE METABOLIC PANEL
ALK PHOS: 68 U/L (ref 38–126)
ALT: 14 U/L (ref 14–54)
ANION GAP: 9 (ref 5–15)
AST: 25 U/L (ref 15–41)
Albumin: 4.3 g/dL (ref 3.5–5.0)
BILIRUBIN TOTAL: 0.7 mg/dL (ref 0.3–1.2)
BUN: 38 mg/dL — ABNORMAL HIGH (ref 6–20)
CALCIUM: 9.2 mg/dL (ref 8.9–10.3)
CO2: 27 mmol/L (ref 22–32)
CREATININE: 1.6 mg/dL — AB (ref 0.44–1.00)
Chloride: 103 mmol/L (ref 101–111)
GFR, EST AFRICAN AMERICAN: 31 mL/min — AB (ref >60)
GFR, EST NON AFRICAN AMERICAN: 27 mL/min — AB (ref >60)
Glucose, Bld: 108 mg/dL — ABNORMAL HIGH (ref 65–99)
Potassium: 3.9 mmol/L (ref 3.5–5.1)
SODIUM: 139 mmol/L (ref 135–145)
Total Protein: 8.1 g/dL (ref 6.5–8.1)

## 2016-06-25 LAB — CBC WITH DIFFERENTIAL/PLATELET
BASOS PCT: 0 %
Basophils Absolute: 0 10*3/uL (ref 0–0.1)
EOS ABS: 0.4 10*3/uL (ref 0–0.7)
Eosinophils Relative: 5 %
HEMATOCRIT: 40.3 % (ref 35.0–47.0)
HEMOGLOBIN: 13.5 g/dL (ref 12.0–16.0)
Lymphocytes Relative: 29 %
Lymphs Abs: 2.2 10*3/uL (ref 1.0–3.6)
MCH: 30.7 pg (ref 26.0–34.0)
MCHC: 33.6 g/dL (ref 32.0–36.0)
MCV: 91.4 fL (ref 80.0–100.0)
Monocytes Absolute: 0.5 10*3/uL (ref 0.2–0.9)
Monocytes Relative: 7 %
NEUTROS ABS: 4.6 10*3/uL (ref 1.4–6.5)
NEUTROS PCT: 59 %
Platelets: 176 10*3/uL (ref 150–440)
RBC: 4.41 MIL/uL (ref 3.80–5.20)
RDW: 13.5 % (ref 11.5–14.5)
WBC: 7.8 10*3/uL (ref 3.6–11.0)

## 2016-06-25 LAB — TROPONIN I

## 2016-06-25 MED ORDER — CEFTRIAXONE SODIUM-DEXTROSE 1-3.74 GM-% IV SOLR
1.0000 g | Freq: Once | INTRAVENOUS | Status: AC
  Administered 2016-06-25: 1 g via INTRAVENOUS
  Filled 2016-06-25: qty 50

## 2016-06-25 MED ORDER — DEXTROSE 5 % IV SOLN: 1.0000 g | Freq: Once | INTRAVENOUS | Status: DC

## 2016-06-25 MED ORDER — SODIUM CHLORIDE 0.9 % IV BOLUS (SEPSIS)
500.0000 mL | Freq: Once | INTRAVENOUS | Status: AC
  Administered 2016-06-25: 500 mL via INTRAVENOUS

## 2016-06-25 MED ORDER — CEPHALEXIN 500 MG PO CAPS: 500.0000 mg | ORAL_CAPSULE | Freq: Three times a day (TID) | ORAL | 0 refills | Status: DC

## 2016-06-25 NOTE — ED Notes (Signed)
Pt assisted to the bathroom by this RN and Crystal, EDT. Pt tolerated well.

## 2016-06-25 NOTE — ED Notes (Signed)
NAD noted at time of D/C. Pt taken to the lobby via wheelchair by her son. Pt and son deny comments/concerns regarding D/C.

## 2016-06-25 NOTE — ED Notes (Signed)
Pt ambulated around room at this time by this RN. Pt tolerated well. Pt noted to walk slowly, states she feels slightly less weak than she did earlier.

## 2016-06-25 NOTE — Discharge Instructions (Signed)
Please seek medical attention for any high fevers, chest pain, shortness of breath, change in behavior, persistent vomiting, bloody stool or any other new or concerning symptoms.  

## 2016-06-25 NOTE — ED Notes (Signed)
NAD noted at this time. Pt continues to rest in bed with her son at bedside. Will continue to monitor for further patient needs.

## 2016-06-25 NOTE — ED Provider Notes (Signed)
[facility] Emergency Department Provider Note   ____________________________________________   I have reviewed the triage vital signs and the nursing notes.   HISTORY  Chief Complaint Fall and Weakness   History limited by: Not Limited   HPI Marisa [name] is a 83 y.o. female who presents to the emergency department today has of concerns for weakness. The patient apparently has been getting progressively weaker over the past few days. The patient was walking up steps today when she started to collapse. She was with her caregiver who is able to catch her so that she did not hit anything or fall. The amylase concerned about progressive weakness. Patient denies any fevers, chest pain, shortness breath, nausea vomiting or diarrhea. No change in urination.   Past Medical History:  Diagnosis Date  . Allergy   . CHF (congestive heart failure) (HCC)   . Falls   . Hyperlipidemia   . Hypertension   . Osteoporosis   . Sleep apnea   . Thyroid disease     Patient Active Problem List   Diagnosis Date Noted  . Carbuncle and furuncle of buttock 09/30/2015  . DJD of shoulder 08/01/2015  . Rotator cuff injury 08/01/2015  . DDD (degenerative disc disease), lumbar 09/03/2014  . Spinal stenosis, lumbar region, with neurogenic claudication 09/03/2014  . Sacroiliac joint dysfunction 09/03/2014  . Carpal tunnel syndrome 09/03/2014    Past Surgical History:  Procedure Laterality Date  . APPENDECTOMY    . CATARACT EXTRACTION W/ INTRAOCULAR LENS  IMPLANT, BILATERAL Bilateral   . CHOLECYSTECTOMY    . HEMORRHOID SURGERY    . JOINT REPLACEMENT    . REPLACEMENT TOTAL KNEE Bilateral     Prior to Admission medications   Medication Sig Start Date End Date Taking? Authorizing Provider  amLODipine (NORVASC) 5 MG tablet TAKE 1 BY MOUTH DAILY FOR BLOOD PRESSURE 07/13/14   Historical Provider, MD  aspirin EC 81 MG tablet Take 81 mg by mouth daily.     Historical Provider, MD   B Complex Vitamins (VITAMIN B COMPLEX) TABS Take by mouth daily.    Historical Provider, MD  benazepril (LOTENSIN) 40 MG tablet TAKE 1 BY MOUTH DAILY 07/13/14   Historical Provider, MD  celecoxib (CELEBREX) 200 MG capsule TAKE 1 BY MOUTH TWICE DAILY 07/13/14   Historical Provider, MD  cilostazol (PLETAL) 100 MG tablet TAKE 1 BY MOUTH TWICE DAILY FOR CIRCULATION 07/13/14   Historical Provider, MD  diclofenac sodium (VOLTAREN) 1 % GEL Apply 2 - 4g to painful area of skin 4 times per day if tolerated 08/06/15   Ewing Schlein, MD  fluticasone [facility]) 50 MCG/ACT nasal spray 1 spray daily as needed for allergies or rhinitis. Reported on 07/09/2015    Historical Provider, MD  furosemide (LASIX) 40 MG tablet Take by mouth 2 (two) times daily.  07/13/14   Historical Provider, MD  gabapentin (NEURONTIN) 300 MG capsule Take 300 mg by mouth 2 (two) times daily.  07/13/14   Historical Provider, MD  HYDROcodone-acetaminophen (NORCO/VICODIN) 5-325 MG tablet Limited 1 tab by mouth 2-4 times/day if tolerated 12/02/15   Ewing Schlein, MD  ketoconazole (NIZORAL) 2 % shampoo Reported on 08/06/2015 11/30/13   Historical Provider, MD  levothyroxine (SYNTHROID, LEVOTHROID) 50 MCG tablet Take 50 mcg by mouth daily before breakfast.  07/13/14   Historical Provider, MD  loratadine (CLARITIN) 10 MG tablet Take 10 mg by mouth daily.    Historical Provider, MD  metoprolol succinate (TOPROL-XL) 100 MG 24 hr tablet  TAKE 1/2 BY MOUTH DAILY 07/13/14   Historical Provider, MD  omeprazole (PRILOSEC) 20 MG capsule TAKE 1 BY MOUTH DAILY 07/13/14   Historical Provider, MD  pravastatin (PRAVACHOL) 40 MG tablet TAKE 1 BY MOUTH EVERY NIGHT 07/13/14   Historical Provider, MD  traMADol (ULTRAM) 50 MG tablet Limit one tab by mouth 1-2 times per day for breakthrough pain while taking hydrocodone acetaminophen if tolerated 12/02/15   Ewing Schlein, MD    Allergies Norpramin [desipramine hcl]; Sulfa antibiotics; Codeine; Latex; and Penicillins  Family  History  Problem Relation Age of Onset  . Hypertension Mother   . Heart disease Father     Social History Social History  Substance Use Topics  . Smoking status: Never Smoker  . Smokeless tobacco: Never Used  . Alcohol use No    Review of Systems  Constitutional: Negative for fever. Cardiovascular: Negative for chest pain. Respiratory: Negative for shortness of breath. Gastrointestinal: Negative for abdominal pain, vomiting and diarrhea. Genitourinary: Negative for dysuria. Musculoskeletal: Negative for back pain. Skin: Negative for rash. Neurological: Negative for headaches, focal weakness or numbness.  10-point ROS otherwise negative.  ____________________________________________   PHYSICAL EXAM:  VITAL SIGNS: ED Triage Vitals  Enc Vitals Group     BP 06/25/16 1506 (!) 215/101     Pulse Rate 06/25/16 1506 80     Resp 06/25/16 1506 16     Temp 06/25/16 1506 97.7 F (36.5 C)     Temp Source 06/25/16 1506 Oral     SpO2 06/25/16 1442 95 %     Weight 06/25/16 1446 186 lb (84.4 kg)     Height 06/25/16 1446  (1.6 m)   Constitutional: Alert and oriented. Well appearing and in no distress. Eyes: Conjunctivae are normal. Normal extraocular movements. ENT   Head: Normocephalic and atraumatic.   Nose: No congestion/rhinnorhea.   Mouth/Throat: Mucous membranes are moist.   Neck: No stridor. Hematological/Lymphatic/Immunilogical: No cervical lymphadenopathy. Cardiovascular: Normal rate, regular rhythm.  No murmurs, rubs, or gallops.  Respiratory: Normal respiratory effort without tachypnea nor retractions. Breath sounds are clear and equal bilaterally. No wheezes/rales/rhonchi. Gastrointestinal: Soft and non tender. No rebound. No guarding.  Genitourinary: Deferred Musculoskeletal: Normal range of motion in all extremities. No lower extremity edema. Neurologic:  Normal speech and language. No gross focal neurologic deficits are appreciated.  Skin:  Skin  is warm, dry and intact. No rash noted. Psychiatric: Mood and affect are normal. Speech and behavior are normal. Patient exhibits appropriate insight and judgment.  ____________________________________________    LABS (pertinent positives/negatives)  Labs Reviewed  URINALYSIS, COMPLETE (UACMP) WITH MICROSCOPIC - Abnormal; Notable for the following:       Result Value   Color, Urine YELLOW (*)    APPearance HAZY (*)    Leukocytes, UA TRACE (*)    Bacteria, UA MANY (*)    Squamous Epithelial / LPF 0-5 (*)    All other components within normal limits  COMPREHENSIVE METABOLIC PANEL - Abnormal; Notable for the following:    Glucose, Bld 108 (*)    BUN 38 (*)    Creatinine, Ser 1.60 (*)    GFR calc non Af Amer 27 (*)    GFR calc Af Amer 31 (*)    All other components within normal limits  CBC WITH DIFFERENTIAL/PLATELET  TROPONIN I     ____________________________________________   EKG  I, Phineas Semen, attending physician, personally viewed and interpreted this EKG  EKG Time: 1504 Rate: 88 Rhythm: sinus rhythm Axis:  left axis deviation Intervals: qtc 494 QRS: RBBB, LAFB ST changes: no st elevation Impression: abnormal ekg   ____________________________________________    RADIOLOGY  Right knee IMPRESSION:  1. No fracture or dislocation.  2. No evidence of loosening of the orthopedic hardware.    Left knee   IMPRESSION:  Unremarkable appearing LEFT TKR.    CXR  IMPRESSION:  COPD. Pulmonary artery hypertension. Atherosclerotic aorta    Mild bibasilar atelectasis/ scarring.    ____________________________________________   PROCEDURES  Procedures  ____________________________________________   INITIAL IMPRESSION / ASSESSMENT AND PLAN / ED COURSE  Pertinent labs & imaging results that were available during my care of the patient were reviewed by me and considered in my medical decision making (see chart for details).  She presented to  the emergency department today because of concerns for weakness. Workup here didn't show signs of a urinary tract infection. I do think this could explain the patient's weakness. Will give patient is an IV antibiotics here in prescription for further antibiotics.  ____________________________________________   FINAL CLINICAL IMPRESSION(S) / ED DIAGNOSES  Final diagnoses:  Lower urinary tract infectious disease     Note: This dictation was prepared with Dragon dictation. Any transcriptional errors that result from this process are unintentional     Phineas Semen, MD 06/25/16 2126

## 2016-06-25 NOTE — ED Triage Notes (Signed)
Pt presents to ED via [facility]. Per EMS pt was going up the stairs today when she became weak and fell to her knees, EMS reports bruising to her R knee at this time. EMS reports patient is alert and oriented at this time, per patient has had all home medications.

## 2016-07-13 ENCOUNTER — Emergency Department: Payer: Medicare Other

## 2016-07-13 ENCOUNTER — Inpatient Hospital Stay
Admission: EM | Admit: 2016-07-13 | Discharge: 2016-07-21 | DRG: 871 | Disposition: E | Payer: Medicare Other | Attending: Internal Medicine | Admitting: Internal Medicine

## 2016-07-13 ENCOUNTER — Inpatient Hospital Stay: Payer: Medicare Other

## 2016-07-13 ENCOUNTER — Encounter: Payer: Self-pay | Admitting: Emergency Medicine

## 2016-07-13 DIAGNOSIS — E785 Hyperlipidemia, unspecified: Secondary | ICD-10-CM | POA: Diagnosis present

## 2016-07-13 DIAGNOSIS — J189 Pneumonia, unspecified organism: Secondary | ICD-10-CM

## 2016-07-13 DIAGNOSIS — R778 Other specified abnormalities of plasma proteins: Secondary | ICD-10-CM

## 2016-07-13 DIAGNOSIS — Z9981 Dependence on supplemental oxygen: Secondary | ICD-10-CM

## 2016-07-13 DIAGNOSIS — R571 Hypovolemic shock: Secondary | ICD-10-CM | POA: Diagnosis present

## 2016-07-13 DIAGNOSIS — G9341 Metabolic encephalopathy: Secondary | ICD-10-CM | POA: Diagnosis present

## 2016-07-13 DIAGNOSIS — M810 Age-related osteoporosis without current pathological fracture: Secondary | ICD-10-CM | POA: Diagnosis present

## 2016-07-13 DIAGNOSIS — E872 Acidosis: Secondary | ICD-10-CM | POA: Diagnosis present

## 2016-07-13 DIAGNOSIS — Z9049 Acquired absence of other specified parts of digestive tract: Secondary | ICD-10-CM

## 2016-07-13 DIAGNOSIS — N179 Acute kidney failure, unspecified: Secondary | ICD-10-CM | POA: Diagnosis present

## 2016-07-13 DIAGNOSIS — Z9841 Cataract extraction status, right eye: Secondary | ICD-10-CM | POA: Diagnosis not present

## 2016-07-13 DIAGNOSIS — A419 Sepsis, unspecified organism: Principal | ICD-10-CM

## 2016-07-13 DIAGNOSIS — R197 Diarrhea, unspecified: Secondary | ICD-10-CM

## 2016-07-13 DIAGNOSIS — Z96653 Presence of artificial knee joint, bilateral: Secondary | ICD-10-CM | POA: Diagnosis present

## 2016-07-13 DIAGNOSIS — E039 Hypothyroidism, unspecified: Secondary | ICD-10-CM | POA: Diagnosis present

## 2016-07-13 DIAGNOSIS — Z8249 Family history of ischemic heart disease and other diseases of the circulatory system: Secondary | ICD-10-CM

## 2016-07-13 DIAGNOSIS — Z7982 Long term (current) use of aspirin: Secondary | ICD-10-CM

## 2016-07-13 DIAGNOSIS — R05 Cough: Secondary | ICD-10-CM

## 2016-07-13 DIAGNOSIS — J9620 Acute and chronic respiratory failure, unspecified whether with hypoxia or hypercapnia: Secondary | ICD-10-CM | POA: Diagnosis present

## 2016-07-13 DIAGNOSIS — R140 Abdominal distension (gaseous): Secondary | ICD-10-CM

## 2016-07-13 DIAGNOSIS — J9601 Acute respiratory failure with hypoxia: Secondary | ICD-10-CM | POA: Diagnosis not present

## 2016-07-13 DIAGNOSIS — R4182 Altered mental status, unspecified: Secondary | ICD-10-CM | POA: Diagnosis present

## 2016-07-13 DIAGNOSIS — K559 Vascular disorder of intestine, unspecified: Secondary | ICD-10-CM | POA: Diagnosis present

## 2016-07-13 DIAGNOSIS — E860 Dehydration: Secondary | ICD-10-CM

## 2016-07-13 DIAGNOSIS — Z452 Encounter for adjustment and management of vascular access device: Secondary | ICD-10-CM

## 2016-07-13 DIAGNOSIS — Z515 Encounter for palliative care: Secondary | ICD-10-CM | POA: Diagnosis not present

## 2016-07-13 DIAGNOSIS — I130 Hypertensive heart and chronic kidney disease with heart failure and stage 1 through stage 4 chronic kidney disease, or unspecified chronic kidney disease: Secondary | ICD-10-CM | POA: Diagnosis present

## 2016-07-13 DIAGNOSIS — R6521 Severe sepsis with septic shock: Secondary | ICD-10-CM | POA: Diagnosis present

## 2016-07-13 DIAGNOSIS — Z888 Allergy status to other drugs, medicaments and biological substances status: Secondary | ICD-10-CM

## 2016-07-13 DIAGNOSIS — Z79891 Long term (current) use of opiate analgesic: Secondary | ICD-10-CM

## 2016-07-13 DIAGNOSIS — R109 Unspecified abdominal pain: Secondary | ICD-10-CM

## 2016-07-13 DIAGNOSIS — Z885 Allergy status to narcotic agent status: Secondary | ICD-10-CM

## 2016-07-13 DIAGNOSIS — Z7951 Long term (current) use of inhaled steroids: Secondary | ICD-10-CM

## 2016-07-13 DIAGNOSIS — Z880 Allergy status to penicillin: Secondary | ICD-10-CM

## 2016-07-13 DIAGNOSIS — Z66 Do not resuscitate: Secondary | ICD-10-CM | POA: Diagnosis present

## 2016-07-13 DIAGNOSIS — Z9842 Cataract extraction status, left eye: Secondary | ICD-10-CM

## 2016-07-13 DIAGNOSIS — J9600 Acute respiratory failure, unspecified whether with hypoxia or hypercapnia: Secondary | ICD-10-CM

## 2016-07-13 DIAGNOSIS — Z882 Allergy status to sulfonamides status: Secondary | ICD-10-CM

## 2016-07-13 DIAGNOSIS — J440 Chronic obstructive pulmonary disease with acute lower respiratory infection: Secondary | ICD-10-CM | POA: Diagnosis present

## 2016-07-13 DIAGNOSIS — K449 Diaphragmatic hernia without obstruction or gangrene: Secondary | ICD-10-CM | POA: Diagnosis present

## 2016-07-13 DIAGNOSIS — R7989 Other specified abnormal findings of blood chemistry: Secondary | ICD-10-CM

## 2016-07-13 DIAGNOSIS — Z961 Presence of intraocular lens: Secondary | ICD-10-CM | POA: Diagnosis present

## 2016-07-13 DIAGNOSIS — E162 Hypoglycemia, unspecified: Secondary | ICD-10-CM | POA: Diagnosis present

## 2016-07-13 DIAGNOSIS — I5020 Unspecified systolic (congestive) heart failure: Secondary | ICD-10-CM | POA: Diagnosis present

## 2016-07-13 DIAGNOSIS — I959 Hypotension, unspecified: Secondary | ICD-10-CM

## 2016-07-13 DIAGNOSIS — I2510 Atherosclerotic heart disease of native coronary artery without angina pectoris: Secondary | ICD-10-CM | POA: Diagnosis present

## 2016-07-13 DIAGNOSIS — R410 Disorientation, unspecified: Secondary | ICD-10-CM

## 2016-07-13 DIAGNOSIS — I700 Atherosclerosis of aorta: Secondary | ICD-10-CM | POA: Diagnosis present

## 2016-07-13 DIAGNOSIS — R059 Cough, unspecified: Secondary | ICD-10-CM

## 2016-07-13 LAB — BLOOD GAS, ARTERIAL
Acid-base deficit: 4.2 mmol/L — ABNORMAL HIGH (ref 0.0–2.0)
Acid-base deficit: 14.4 mmol/L — ABNORMAL HIGH (ref 0.0–2.0)
BICARBONATE: 15.2 mmol/L — AB (ref 20.0–28.0)
Bicarbonate: 21 mmol/L (ref 20.0–28.0)
FIO2: 0.28
FIO2: 1
O2 SAT: 93.8 %
O2 Saturation: 91.3 %
PATIENT TEMPERATURE: 37
PCO2 ART: 49 mmHg — AB (ref 32.0–48.0)
PO2 ART: 94 mmHg (ref 83.0–108.0)
Patient temperature: 37
pCO2 arterial: 38 mmHg (ref 32.0–48.0)
pH, Arterial: 7.35 (ref 7.350–7.450)
pH, Arterial: 7.1 — CL (ref 7.350–7.450)
pO2, Arterial: 65 mmHg — ABNORMAL LOW (ref 83.0–108.0)

## 2016-07-13 LAB — URINALYSIS, COMPLETE (UACMP) WITH MICROSCOPIC
Bilirubin Urine: NEGATIVE
Glucose, UA: NEGATIVE mg/dL
Hgb urine dipstick: NEGATIVE
Ketones, ur: NEGATIVE mg/dL
Leukocytes, UA: NEGATIVE
Nitrite: NEGATIVE
Protein, ur: NEGATIVE mg/dL
Specific Gravity, Urine: 1.01 (ref 1.005–1.030)
pH: 5 (ref 5.0–8.0)

## 2016-07-13 LAB — CBC
HCT: 40.6 % (ref 35.0–47.0)
HCT: 38.8 % (ref 35.0–47.0)
Hemoglobin: 13.8 g/dL (ref 12.0–16.0)
Hemoglobin: 12.7 g/dL (ref 12.0–16.0)
MCH: 31.2 pg (ref 26.0–34.0)
MCH: 30.2 pg (ref 26.0–34.0)
MCHC: 34 g/dL (ref 32.0–36.0)
MCHC: 32.7 g/dL (ref 32.0–36.0)
MCV: 91.7 fL (ref 80.0–100.0)
MCV: 92.3 fL (ref 80.0–100.0)
PLATELETS: 217 10*3/uL (ref 150–440)
PLATELETS: 221 10*3/uL (ref 150–440)
RBC: 4.43 MIL/uL (ref 3.80–5.20)
RBC: 4.21 MIL/uL (ref 3.80–5.20)
RDW: 13.7 % (ref 11.5–14.5)
RDW: 14.3 % (ref 11.5–14.5)
WBC: 24.8 10*3/uL — ABNORMAL HIGH (ref 3.6–11.0)
WBC: 26.9 10*3/uL — ABNORMAL HIGH (ref 3.6–11.0)

## 2016-07-13 LAB — COMPREHENSIVE METABOLIC PANEL
ALBUMIN: 3.7 g/dL (ref 3.5–5.0)
ALT: 18 U/L (ref 14–54)
ALT: 255 U/L — AB (ref 14–54)
AST: 36 U/L (ref 15–41)
AST: 294 U/L — ABNORMAL HIGH (ref 15–41)
Albumin: 3 g/dL — ABNORMAL LOW (ref 3.5–5.0)
Alkaline Phosphatase: 148 U/L — ABNORMAL HIGH (ref 38–126)
Alkaline Phosphatase: 257 U/L — ABNORMAL HIGH (ref 38–126)
Anion gap: 13 (ref 5–15)
Anion gap: 16 — ABNORMAL HIGH (ref 5–15)
BILIRUBIN TOTAL: 1 mg/dL (ref 0.3–1.2)
BILIRUBIN TOTAL: 1.1 mg/dL (ref 0.3–1.2)
BUN: 43 mg/dL — AB (ref 6–20)
BUN: 54 mg/dL — AB (ref 6–20)
CALCIUM: 8.9 mg/dL (ref 8.9–10.3)
CHLORIDE: 107 mmol/L (ref 101–111)
CO2: 22 mmol/L (ref 22–32)
CO2: 16 mmol/L — ABNORMAL LOW (ref 22–32)
CREATININE: 1.87 mg/dL — AB (ref 0.44–1.00)
CREATININE: 2.66 mg/dL — AB (ref 0.44–1.00)
Calcium: 8 mg/dL — ABNORMAL LOW (ref 8.9–10.3)
Chloride: 113 mmol/L — ABNORMAL HIGH (ref 101–111)
GFR calc Af Amer: 26 mL/min — ABNORMAL LOW (ref >60)
GFR calc non Af Amer: 22 mL/min — ABNORMAL LOW (ref >60)
GFR, EST AFRICAN AMERICAN: 17 mL/min — AB (ref >60)
GFR, EST NON AFRICAN AMERICAN: 15 mL/min — AB (ref >60)
GLUCOSE: 169 mg/dL — AB (ref 65–99)
GLUCOSE: 80 mg/dL (ref 65–99)
Potassium: 3.5 mmol/L (ref 3.5–5.1)
Potassium: 4.6 mmol/L (ref 3.5–5.1)
Sodium: 142 mmol/L (ref 135–145)
Sodium: 145 mmol/L (ref 135–145)
Total Protein: 7 g/dL (ref 6.5–8.1)
Total Protein: 6.1 g/dL — ABNORMAL LOW (ref 6.5–8.1)

## 2016-07-13 LAB — TROPONIN I: Troponin I: 0.05 ng/mL (ref <0.03)

## 2016-07-13 LAB — GLUCOSE, CAPILLARY
Glucose-Capillary: 117 mg/dL — ABNORMAL HIGH (ref 65–99)
Glucose-Capillary: 158 mg/dL — ABNORMAL HIGH (ref 65–99)
Glucose-Capillary: 52 mg/dL — ABNORMAL LOW (ref 65–99)

## 2016-07-13 LAB — LIPASE, BLOOD: LIPASE: 17 U/L (ref 11–51)

## 2016-07-13 LAB — LACTIC ACID, PLASMA
LACTIC ACID, VENOUS: 2.9 mmol/L — AB (ref 0.5–1.9)
LACTIC ACID, VENOUS: 2.2 mmol/L — AB (ref 0.5–1.9)

## 2016-07-13 LAB — TSH: TSH: 5.818 u[IU]/mL — ABNORMAL HIGH (ref 0.350–4.500)

## 2016-07-13 MED ORDER — DEXTROSE 5 % IV SOLN: 1.0000 g | INTRAVENOUS | Status: DC

## 2016-07-13 MED ORDER — PRAVASTATIN SODIUM 40 MG PO TABS: 40.0000 mg | ORAL_TABLET | Freq: Every day | ORAL | Status: DC

## 2016-07-13 MED ORDER — FUROSEMIDE 40 MG PO TABS: 40.0000 mg | ORAL_TABLET | Freq: Two times a day (BID) | ORAL | Status: DC

## 2016-07-13 MED ORDER — LORATADINE 10 MG PO TABS
10.0000 mg | ORAL_TABLET | Freq: Every day | ORAL | Status: DC
  Administered 2016-07-13: 10 mg via ORAL
  Filled 2016-07-13: qty 1

## 2016-07-13 MED ORDER — SODIUM BICARBONATE 8.4 % IV SOLN
50.0000 meq | Freq: Once | INTRAVENOUS | Status: AC
  Administered 2016-07-13: 50 meq via INTRAVENOUS

## 2016-07-13 MED ORDER — ONDANSETRON HCL 4 MG/2ML IJ SOLN: 4.0000 mg | Freq: Four times a day (QID) | INTRAMUSCULAR | Status: DC | PRN

## 2016-07-13 MED ORDER — PANTOPRAZOLE SODIUM 40 MG PO TBEC
40.0000 mg | DELAYED_RELEASE_TABLET | Freq: Every day | ORAL | Status: DC
  Administered 2016-07-13: 40 mg via ORAL
  Filled 2016-07-13: qty 1

## 2016-07-13 MED ORDER — SODIUM BICARBONATE 8.4 % IV SOLN
INTRAVENOUS | Status: AC
  Administered 2016-07-13: 50 meq via INTRAVENOUS
  Filled 2016-07-13: qty 150

## 2016-07-13 MED ORDER — ENOXAPARIN SODIUM 30 MG/0.3ML ~~LOC~~ SOLN
30.0000 mg | SUBCUTANEOUS | Status: DC
  Administered 2016-07-14: 30 mg via SUBCUTANEOUS
  Filled 2016-07-13: qty 0.3

## 2016-07-13 MED ORDER — CEFTRIAXONE SODIUM-DEXTROSE 1-3.74 GM-% IV SOLR
1.0000 g | Freq: Once | INTRAVENOUS | Status: AC
  Administered 2016-07-13: 1 g via INTRAVENOUS
  Filled 2016-07-13: qty 50

## 2016-07-13 MED ORDER — SODIUM CHLORIDE 0.9 % IV SOLN
0.0000 ug/min | INTRAVENOUS | Status: DC
  Administered 2016-07-14: 100 ug/min via INTRAVENOUS
  Administered 2016-07-14: 30 ug/min via INTRAVENOUS
  Administered 2016-07-14: 80 ug/min via INTRAVENOUS
  Filled 2016-07-13 (×3): qty 1

## 2016-07-13 MED ORDER — DEXTROSE 50 % IV SOLN
INTRAVENOUS | Status: AC
  Administered 2016-07-13: 50 mL via INTRAVENOUS
  Filled 2016-07-13: qty 50

## 2016-07-13 MED ORDER — SODIUM BICARBONATE 8.4 % IV SOLN: 150.0000 meq | Freq: Once | INTRAVENOUS | Status: DC

## 2016-07-13 MED ORDER — SODIUM CHLORIDE 0.9 % IV BOLUS (SEPSIS): 1000.0000 mL | Freq: Once | INTRAVENOUS | Status: DC

## 2016-07-13 MED ORDER — METRONIDAZOLE 500 MG PO TABS
500.0000 mg | ORAL_TABLET | Freq: Three times a day (TID) | ORAL | Status: DC
  Filled 2016-07-13 (×2): qty 1

## 2016-07-13 MED ORDER — ACETAMINOPHEN 650 MG RE SUPP: 650.0000 mg | Freq: Four times a day (QID) | RECTAL | Status: DC | PRN

## 2016-07-13 MED ORDER — DEXTROSE 50 % IV SOLN
50.0000 mL | Freq: Once | INTRAVENOUS | Status: AC
  Administered 2016-07-13: 50 mL via INTRAVENOUS

## 2016-07-13 MED ORDER — SODIUM BICARBONATE 8.4 % IV SOLN
100.0000 meq | Freq: Once | INTRAVENOUS | Status: AC
  Administered 2016-07-14: 100 meq via INTRAVENOUS

## 2016-07-13 MED ORDER — ASPIRIN EC 81 MG PO TBEC
81.0000 mg | DELAYED_RELEASE_TABLET | Freq: Every day | ORAL | Status: DC
  Administered 2016-07-13: 81 mg via ORAL
  Filled 2016-07-13: qty 1

## 2016-07-13 MED ORDER — PIPERACILLIN-TAZOBACTAM 3.375 G IVPB
3.3750 g | Freq: Two times a day (BID) | INTRAVENOUS | Status: DC
  Administered 2016-07-14 (×2): 3.375 g via INTRAVENOUS
  Filled 2016-07-13 (×4): qty 50

## 2016-07-13 MED ORDER — AZITHROMYCIN 250 MG PO TABS: 250.0000 mg | ORAL_TABLET | Freq: Every day | ORAL | Status: DC

## 2016-07-13 MED ORDER — ACETAMINOPHEN 325 MG PO TABS: 650.0000 mg | ORAL_TABLET | Freq: Four times a day (QID) | ORAL | Status: DC | PRN

## 2016-07-13 MED ORDER — TRAMADOL HCL 50 MG PO TABS: 50.0000 mg | ORAL_TABLET | Freq: Two times a day (BID) | ORAL | Status: DC | PRN

## 2016-07-13 MED ORDER — HYDROCODONE-ACETAMINOPHEN 5-325 MG PO TABS
1.0000 | ORAL_TABLET | Freq: Four times a day (QID) | ORAL | Status: DC | PRN
  Administered 2016-07-13: 1 via ORAL
  Filled 2016-07-13: qty 1

## 2016-07-13 MED ORDER — HEPARIN SODIUM (PORCINE) 5000 UNIT/ML IJ SOLN: 5000.0000 [IU] | Freq: Three times a day (TID) | INTRAMUSCULAR | Status: DC

## 2016-07-13 MED ORDER — LEVOTHYROXINE SODIUM 50 MCG PO TABS
50.0000 ug | ORAL_TABLET | Freq: Every day | ORAL | Status: DC
Start: 2016-07-13 — End: 2016-07-14
  Administered 2016-07-13: 50 ug via ORAL
  Filled 2016-07-13: qty 1

## 2016-07-13 MED ORDER — ENOXAPARIN SODIUM 40 MG/0.4ML ~~LOC~~ SOLN: 40.0000 mg | SUBCUTANEOUS | Status: DC

## 2016-07-13 MED ORDER — IOPAMIDOL (ISOVUE-300) INJECTION 61%: 30.0000 mL | Freq: Once | INTRAVENOUS | Status: DC

## 2016-07-13 MED ORDER — SODIUM BICARBONATE 8.4 % IV SOLN
INTRAVENOUS | Status: AC
  Administered 2016-07-14: 100 meq via INTRAVENOUS
  Filled 2016-07-13: qty 100

## 2016-07-13 MED ORDER — SODIUM CHLORIDE 0.9 % IV BOLUS (SEPSIS)
1000.0000 mL | Freq: Once | INTRAVENOUS | Status: AC
  Administered 2016-07-13: 1000 mL via INTRAVENOUS

## 2016-07-13 MED ORDER — SODIUM CHLORIDE 0.9 % IV BOLUS (SEPSIS)
250.0000 mL | Freq: Once | INTRAVENOUS | Status: AC
  Administered 2016-07-13: 250 mL via INTRAVENOUS

## 2016-07-13 MED ORDER — SODIUM BICARBONATE 8.4 % IV SOLN
INTRAVENOUS | Status: DC
  Administered 2016-07-13 – 2016-07-14 (×3): via INTRAVENOUS
  Filled 2016-07-13 (×6): qty 150

## 2016-07-13 MED ORDER — IPRATROPIUM-ALBUTEROL 0.5-2.5 (3) MG/3ML IN SOLN
3.0000 mL | Freq: Four times a day (QID) | RESPIRATORY_TRACT | Status: DC
  Administered 2016-07-13 – 2016-07-14 (×3): 3 mL via RESPIRATORY_TRACT
  Filled 2016-07-13 (×4): qty 3

## 2016-07-13 MED ORDER — ONDANSETRON HCL 4 MG PO TABS: 4.0000 mg | ORAL_TABLET | Freq: Four times a day (QID) | ORAL | Status: DC | PRN

## 2016-07-13 MED ORDER — DEXTROSE 5 % IV SOLN
500.0000 mg | Freq: Once | INTRAVENOUS | Status: AC
  Administered 2016-07-13: 500 mg via INTRAVENOUS
  Filled 2016-07-13: qty 500

## 2016-07-13 MED ORDER — CILOSTAZOL 100 MG PO TABS
100.0000 mg | ORAL_TABLET | Freq: Two times a day (BID) | ORAL | Status: DC
  Filled 2016-07-13 (×3): qty 1

## 2016-07-13 MED ORDER — GABAPENTIN 300 MG PO CAPS
300.0000 mg | ORAL_CAPSULE | Freq: Two times a day (BID) | ORAL | Status: DC
  Administered 2016-07-13: 300 mg via ORAL
  Filled 2016-07-13: qty 1

## 2016-07-13 MED ORDER — SODIUM CHLORIDE 0.9% FLUSH
3.0000 mL | Freq: Two times a day (BID) | INTRAVENOUS | Status: DC
  Administered 2016-07-13 – 2016-07-14 (×3): 3 mL via INTRAVENOUS

## 2016-07-13 MED ORDER — FLUTICASONE PROPIONATE 50 MCG/ACT NA SUSP
1.0000 | Freq: Every day | NASAL | Status: DC | PRN
  Filled 2016-07-13: qty 16

## 2016-07-13 MED ORDER — SODIUM CHLORIDE 0.9 % IV SOLN
INTRAVENOUS | Status: AC
Start: 2016-07-13 — End: 2016-07-13
  Administered 2016-07-13: 11:00:00 via INTRAVENOUS

## 2016-07-13 MED ORDER — METOPROLOL SUCCINATE ER 50 MG PO TB24: 50.0000 mg | ORAL_TABLET | Freq: Every day | ORAL | Status: DC

## 2016-07-13 MED ORDER — METRONIDAZOLE IN NACL 5-0.79 MG/ML-% IV SOLN
500.0000 mg | Freq: Three times a day (TID) | INTRAVENOUS | Status: DC
  Administered 2016-07-13 – 2016-07-14 (×2): 500 mg via INTRAVENOUS
  Filled 2016-07-13 (×5): qty 100

## 2016-07-13 MED ORDER — VANCOMYCIN HCL 10 G IV SOLR
1500.0000 mg | Freq: Once | INTRAVENOUS | Status: AC
  Administered 2016-07-14: 1500 mg via INTRAVENOUS
  Filled 2016-07-13: qty 1500

## 2016-07-13 NOTE — ED Notes (Signed)
Family reports pt. Was constipated yesterday and was given OTC laxative today.  Family reports multiple diarrhea episodes tonight.

## 2016-07-13 NOTE — ED Notes (Signed)
Pt.  Daughter states pt. Was with her before EMS arrived and they were sitting on bed.  Daughter states she was trying to get up to use bathroom.  Both slowly slid off bed, daughter states they both set down on floor slowly and softly.  Daughter states, pt. Has been complaining of rt. Hip to knee pain.  Pt. Has bruising to outside of rt. Knee.

## 2016-07-13 NOTE — ED Notes (Signed)
Notified Dr. Allena Katz of BP 89/60 prior to transfer to 2A.  MAP stable at this time.  MD aware; verbal order provided for bolus.  Brett Canales, RN notified of update upon transfer.

## 2016-07-13 NOTE — ED Notes (Signed)
Pt. Had moderate amount of diarrhea in under garment , pt. Changed into clean under garment.

## 2016-07-13 NOTE — Progress Notes (Signed)
PT Cancellation Note  Patient Details Name: Marisa [name] MRN: [id number] DOB: [birth year]   Cancelled Treatment:    Reason Eval/Treat Not Completed: Patient's level of consciousness;Fatigue/lethargy limiting ability to participate Banker and patient's daughter reporting the patient remains somnolent and very confused, unabel to follow simple instructions at this time. ) Will follow remotely and attempt PT evaluation again at later date/time as appropriate.    Sherrilyn Nairn C 07/17/2016, 2:02 PM  2:02 PM, 06/23/2016 Rosamaria Lints, PT, DPT Physical Therapist - [place] [phone] (mobile)

## 2016-07-13 NOTE — ED Triage Notes (Signed)
Pt arrives to ED via [facility] with c/o of diarrhea and syncope. Pt experienced LOC in a witnessed fall this morning per EMS. EMS reported no head trauma. Pt has been experiencing constipation for the last couple of days and using [place] which produced diarrhea this morning. EMS VS were CBG 72, BP 142/105, and 89% on 2L which is chronic at home. Pt is on non re breather at this time in triage.

## 2016-07-13 NOTE — ED Notes (Signed)
Patient transported to CT 

## 2016-07-13 NOTE — Progress Notes (Addendum)
Pt was having difficulty breathing with O2 sat's in the 80's on 3L [place]. O2 increased to 6L with no change in sat's. Lung sounds diminished with some expiratory wheezing.  Nonrebreather mask applied, O2 sat's increased to 95%.  MD Anne Hahn made aware, STAT ABG ordered.

## 2016-07-13 NOTE — Consult Note (Addendum)
Name: Marisa [name] MRN: [id number] DOB: 18-Mar-1924    ADMISSION DATE:  07/12/2016 CONSULTATION DATE:  06/29/2016  REFERRING MD :  Dr. Anne Hahn  CHIEF COMPLAINT:  Altered Mental Status   BRIEF PATIENT DESCRIPTION:  83 yo female admitted to telemetry unit 04/23 with acute respiratory failure secondary to metabolic acidosis, septic shock, metabolic encephalopathy, diarrhea secondary to colitis, and acute on chronic renal failure  SIGNIFICANT EVENTS  04/23-Pt admitted to telemetry unit then transferred to ICU following a rapid response with acute respiratory failure secondary to metabolic acidosis, metabolic encephalopathy, and diarrhea secondary to colitis  STUDIES:  CT Abd Pelvis 04/23>>Inflammation involving the colon from the sigmoid distally. Considerations include infection (exclude C difficile clinically) and stercoral colitis (given moderate stool ball in the rectum). Multifactorial degradation. Coronary artery atherosclerosis. Aortic atherosclerosis. Small hiatal hernia. CT Head 04/23>>Atrophy and small vessel disease, similar to priors. No acute intracranial findings.  HISTORY OF PRESENT ILLNESS:   This is a 83 yo female with a PMH of Thyroid Disease, OSA, Osteoporosis, HTN, Hyperlipidemia, Falls, CHF, and Allergies.  She presented to [facility] ER 04/23 with c/o weakness and episodes of diarrhea following use of laxatives onset today 04/23. According to pts daughter the pt completed a course of abx 04/16 for treatment of a UTI.  Per ER notes according to pts daughter the evening of 04/23 the pt got up to use the restroom became weak and slumped down to the floor with the support of her daughter.  Therefore, EMS was notified upon EMS arrival they attempted to pick her up and she became dazed and unresponsive.  In the ER the pt remained mildly confused and was found to be hypotensive, therefore she received multiple fluid boluses.  CXR revealed left lung infiltrate vs. atelectasis she was  placed on 2L O2 via nasal canula and started on abx.  She was subsequently admitted to the telemetry unit for further workup and management by hospitalist.  She required transfer to [facility] Unit following a rapid response due to metabolic encephalopathy requiring continuous Bipap.     PAST MEDICAL HISTORY :   has a past medical history of Allergy; CHF (congestive heart failure) (HCC); Falls; Hyperlipidemia; Hypertension; Osteoporosis; Sleep apnea; and Thyroid disease.  has a past surgical history that includes Appendectomy; Cholecystectomy; Joint replacement; Replacement total knee (Bilateral); Cataract extraction w/ intraocular lens  implant, bilateral (Bilateral); and Hemorrhoid surgery. Prior to Admission medications   Medication Sig Start Date End Date Taking? Authorizing Provider  amLODipine (NORVASC) 5 MG tablet TAKE 1 BY MOUTH DAILY FOR BLOOD PRESSURE 07/13/14  Yes Historical Provider, MD  aspirin EC 81 MG tablet Take 81 mg by mouth daily.    Yes Historical Provider, MD  B Complex Vitamins (VITAMIN B COMPLEX) TABS Take by mouth daily.   Yes Historical Provider, MD  benazepril (LOTENSIN) 40 MG tablet TAKE 1 BY MOUTH DAILY 07/13/14  Yes Historical Provider, MD  celecoxib (CELEBREX) 200 MG capsule TAKE 1 BY MOUTH TWICE DAILY 07/13/14  Yes Historical Provider, MD  cilostazol (PLETAL) 100 MG tablet TAKE 1 BY MOUTH TWICE DAILY FOR CIRCULATION 07/13/14  Yes Historical Provider, MD  diclofenac sodium (VOLTAREN) 1 % GEL Apply 2 - 4g to painful area of skin 4 times per day if tolerated 08/06/15  Yes Ewing Schlein, MD  fluticasone [facility]) 50 MCG/ACT nasal spray 1 spray daily as needed for allergies or rhinitis. Reported on 07/09/2015   Yes Historical Provider, MD  furosemide (LASIX) 40 MG tablet Take 80  mg by mouth daily.  07/13/14  Yes Historical Provider, MD  gabapentin (NEURONTIN) 300 MG capsule Take 300 mg by mouth 2 (two) times daily.  07/13/14  Yes Historical Provider, MD  HYDROcodone-acetaminophen  (NORCO/VICODIN) 5-325 MG tablet Limited 1 tab by mouth 2-4 times/day if tolerated Patient taking differently: Take 1 tablet by mouth every 6 (six) hours as needed.  12/02/15  Yes Ewing Schlein, MD  ketoconazole (NIZORAL) 2 % shampoo Reported on 08/06/2015 11/30/13  Yes Historical Provider, MD  levothyroxine (SYNTHROID, LEVOTHROID) 50 MCG tablet Take 50 mcg by mouth daily before breakfast.  07/13/14  Yes Historical Provider, MD  loratadine (CLARITIN) 10 MG tablet Take 10 mg by mouth daily.   Yes Historical Provider, MD  metoprolol succinate (TOPROL-XL) 100 MG 24 hr tablet TAKE 1/2 BY MOUTH DAILY 07/13/14  Yes Historical Provider, MD  nystatin cream (MYCOSTATIN) Apply 1 application topically 2 (two) times daily.   Yes Historical Provider, MD  omeprazole (PRILOSEC) 20 MG capsule TAKE 1 BY MOUTH DAILY 07/13/14  Yes Historical Provider, MD  pravastatin (PRAVACHOL) 40 MG tablet TAKE 1 BY MOUTH EVERY NIGHT 07/13/14  Yes Historical Provider, MD  traMADol (ULTRAM) 50 MG tablet Limit one tab by mouth 1-2 times per day for breakthrough pain while taking hydrocodone acetaminophen if tolerated Patient taking differently: Take 50 mg by mouth every 8 (eight) hours as needed.  12/02/15  Yes Ewing Schlein, MD   Allergies  Allergen Reactions  . Norpramin [Desipramine Hcl]     unkown  reaction  . Sulfa Antibiotics     Red spots on skin  . Codeine Rash  . Latex Rash  . Penicillins Rash    FAMILY HISTORY:  family history includes Heart disease in her father; Hypertension in her mother. SOCIAL HISTORY:  reports that she has never smoked. She has never used smokeless tobacco. She reports that she does not drink alcohol or use drugs.  REVIEW OF SYSTEMS:   Unable to assess pt lethargic on continuous Bipap  SUBJECTIVE:  Pt lethargic on Bipap  VITAL SIGNS: Temp:  [97.5 F (36.4 C)-98.3 F (36.8 C)] 98.3 F (36.8 C) (04/23 1957) Pulse Rate:  [69-83] 81 (04/23 1957) Resp:  [15-32] 18 (04/23 1957) BP:  (75-112)/(34-94) 95/68 (04/23 1957) SpO2:  [80 %-100 %] 90 % (04/23 2038) Weight:  [74.8 kg (165 lb)] 74.8 kg (165 lb) (04/23 0420)  PHYSICAL EXAMINATION: General: well developed, well nourished Caucasian female  Neuro: opens eyes and withdraws from painful stimulation, not following commands, PERRL HEENT: supple, no JVD Cardiovascular: sinus rhythm, s1s2, no M/R/G Lungs: rhonchi and diminished throughout, mildly labored and tachypneic on continuous Bipap Abdomen: +BS x4, obese, soft, non tender, non distended  Musculoskeletal:  Normal tone, no edema  Skin: buttocks reddened, no rashes present    Recent Labs Lab 07/17/2016 0433  NA 142  K 3.5  CL 107  CO2 22  BUN 43*  CREATININE 1.87*  GLUCOSE 169*    Recent Labs Lab 07/12/2016 0433 07/10/2016 2127  HGB 13.8 12.7  HCT 40.6 38.8  WBC 24.8* 26.9*  PLT 217 221   Ct Abdomen Pelvis Wo Contrast  Result Date: 07/17/2016 CLINICAL DATA:  Diarrhea.  Syncope.  Altered mental status. EXAM: CT ABDOMEN AND PELVIS WITHOUT CONTRAST TECHNIQUE: Multidetector CT imaging of the abdomen and pelvis was performed following the standard protocol without IV contrast. COMPARISON:  Plain films of earlier today. FINDINGS: Mild degradation secondary to motion, EKG leads/ wires, and lack of oral or intravenous contrast.  Lower chest: Left infrahilar volume loss. Moderate cardiomegaly, without pericardial effusion. Multivessel coronary artery atherosclerosis. Small hiatal hernia. Hepatobiliary: Normal liver. Cholecystectomy, without biliary ductal dilatation. Pancreas: Normal pancreas for age, with mild-to-moderate atrophy. Spleen: Normal in size, without focal abnormality. Adrenals/Urinary Tract: Normal adrenal glands. Mild renal cortical thinning bilaterally. No renal calculi or hydronephrosis. No hydroureter or ureteric calculi. No bladder calculi. Stomach/Bowel: Normal remainder of the stomach. Moderate amount of stool in the rectum. Wall thickening of and edema  surrounding the sigmoid and rectum. Fluid-filled colon proximal to the rectum. The cecum is redundant, positioned in the left central pelvis. Normal terminal ileum. Appendix not visualized but no pericecal inflammation is seen. Normal small bowel. Vascular/Lymphatic: Advanced aortic and branch vessel atherosclerosis. Non aneurysmal infrarenal aortic dilatation at 2.5 cm. No abdominopelvic adenopathy. Reproductive: Normal uterus and adnexa. Other: No significant free fluid. A tiny fat containing right inguinal hernia. Musculoskeletal: Convex left lumbar spine curvature. Advanced lumbosacral spondylosis with grade 1 L5-S1 anterolisthesis IMPRESSION: 1. Inflammation involving the colon from the sigmoid distally. Considerations include infection (exclude C difficile clinically) and stercoral colitis (given moderate stool ball in the rectum). 2. Multifactorial degradation. 3.  Coronary artery atherosclerosis. Aortic atherosclerosis. 4. Small hiatal hernia. Electronically Signed   By: Jeronimo Greaves M.D.   On: 07-25-2016 08:43   Dg Abd 1 View  Result Date: 25-Jul-2016 CLINICAL DATA:  Generalized abdominal pain and constipation. EXAM: ABDOMEN - 1 VIEW COMPARISON:  None. FINDINGS: A small amount of gas is present in nondilated bowel loops in the lower abdomen. No dilated loops of bowel are seen to suggest obstruction. No gross intraperitoneal free air is identified on this supine study. Cholecystectomy clips, vascular calcifications, and S-shaped thoracolumbar scoliosis with disc degeneration are noted. Left lung base opacities are more fully evaluated on today's earlier chest radiograph. IMPRESSION: No evidence of bowel obstruction. Electronically Signed   By: Sebastian Ache M.D.   On: 25-Jul-2016 07:42   Ct Head Wo Contrast  Result Date: 2016-07-25 CLINICAL DATA:  Diarrhea, syncope, altered mentation.Generalized weakness. Confusion. EXAM: CT HEAD WITHOUT CONTRAST TECHNIQUE: Contiguous axial images were obtained from the  base of the skull through the vertex without intravenous contrast. COMPARISON:  12/23/2011. FINDINGS: Brain: No evidence for acute infarction, hemorrhage, mass lesion, hydrocephalus, or extra-axial fluid. Mild atrophy, not unexpected for age. Hypoattenuation of white matter consistent with small vessel disease. Vascular: Advanced vascular calcification of the internal carotid arteries at the skullbase as well as the distal vertebral arteries. No signs of large vessel occlusion. Skull: Normal. Negative for fracture or focal lesion. Sinuses/Orbits: No acute finding. Other: None. IMPRESSION: Atrophy and small vessel disease, similar to priors. No acute intracranial findings. Electronically Signed   By: Elsie Stain M.D.   On: Jul 25, 2016 08:37   Dg Chest Port 1 View  Result Date: 25-Jul-2016 CLINICAL DATA:  83 year old female with abdominal pain. The patient experienced loss of consciousness and fall. EXAM: PORTABLE CHEST 1 VIEW COMPARISON:  Chest radiograph dated 06/25/2016 FINDINGS: COPD changes. Left lung base opacity may represent scarring or combination of pleural effusion and associated atelectasis/ infiltrate. The right lung is clear. There is no pneumothorax. There is mild cardiomegaly. There is atherosclerotic calcification of the aortic arch. No acute osseous pathology. IMPRESSION: Left lung base scarring versus small pleural effusion and associated atelectasis/ infiltrate. Stable mild cardiomegaly. Electronically Signed   By: Elgie Collard M.D.   On: 07/25/16 05:44    ASSESSMENT / PLAN: Acute respiratory failure secondary to metabolic acidosis  Metabolic  acidosis secondary to diarrhea due to colitis  Septic and Hypovolemic shock secondary to colitis  Metabolic Encephalopathy Lactic acidosis  Acute on chronic renal failure secondary to hypovolemia  Hypoglycemia  P: Continuous Bipap for now wean as tolerated  Maintain O2 sats >92% 1 amp Sodium Bicarb then Sodium Bicarb gtt Cdiff and GI  panel results pending Nursing staff to check for an impaction  Trend WBC and monitor fever curve Trend PCT's and lactic acid Continue abx for now pending cdiff and culture results  Maintain map >65 gentle fluid resuscitation  Trend BMP's  Replace electrolytes as indicated Monitor UOP Follow hypoglycemic protocol CBG's q1hr for now  Repeat CXR in am Intermittent ABG's Avoid sedating medications  Pt may need MRI of Brain and Neurology consult if she continues to have acute encephalopathy HIGH RISK FOR INTUBATION-- Now DNR.   Will speak with pts daughter regarding code status and goals of treatment 08/03/2016.  [name], AGNP  Pulmonary/Critical Care Pager [phone] (please enter 7 digits) PCCM Consult Pager [phone] (please enter 7 digits)   Pt seen and examined with NP, above note reflects my findings, assessment, and plan.  83 yo female presents with abd pain and diarrhea, she now appears to be in severe septic shock with acute respiratory failure. This appears consistent with bowel colitis, suspect ischemic colitis, given lactic acidosis.  Continue bicarb drip, continue Bipap. Pt is DNR, her prognosis is very poor, will continue conservative measures. Currently no family at bedside, palliative consultation entered.   Wells Guiles, M.D.  06/25/2016   Critical Care Attestation.  I have personally obtained a history, examined the patient, evaluated laboratory and imaging results, formulated the assessment and plan and placed orders. The Patient requires high complexity decision making for assessment and support, frequent evaluation and titration of therapies, application of advanced monitoring technologies and extensive interpretation of multiple databases. The patient has critical illness that could lead imminently to failure of 1 or more organ systems and requires the highest level of physician preparedness to intervene.  Critical Care Time devoted to patient care  services described in this note is 38 minutes and is exclusive of time spent in procedures.

## 2016-07-13 NOTE — Progress Notes (Signed)
Anticoagulation monitoring(Lovenox):  83yo  female ordered Lovenox 40 mg Q24h for DVT prevention  Filed Weights   08-05-16 0420  Weight: 165 lb (74.8 kg)    Lab Results  Component Value Date   CREATININE 1.87 (H) 08-05-16   CREATININE 1.60 (H) 06/25/2016   CREATININE 1.24 12/23/2011   Estimated Creatinine Clearance: 19 mL/min (A) (by C-G formula based on SCr of 1.87 mg/dL (H)). Hemoglobin & Hematocrit     Component Value Date/Time   HGB 13.8 08/05/16 0433   HGB 12.3 12/23/2011 2253   HCT 40.6 08-05-2016 0433   HCT 35.3 12/23/2011 2253     Per Protocol for Patient with estCrcl < 30 ml/min and BMI < 40, will transition to Lovenox 30 mg Q24h.     Marisa [name], PharmD, BCPS Aug 05, 2016 12:03 PM

## 2016-07-13 NOTE — H&P (Signed)
Marisa [name] is an 83 y.o. female.   Chief Complaint: Weakness HPI: The patient with past medical history of CHF presents emergency department complaining of weakness. The patient has had multiple episodes of diarrhea today after taking a laxative. She got up to use the restroom this evening when she became weak and slumped down to the floor with the support of her daughter. Her daughter called EMS who attempted to pick the patient up when she became dazed and unresponsive. Notably, the patient's eyes were open and she simply was not interacting with first responders. Upon arrival to the emergency department she was mildly confused. She was found to be hypotensive and received multiple boluses of IV fluid. She is on 2 L of oxygen via nasal cannula at all times. Chest x-ray showed pneumonia. She was given ceftriaxone and azithromycin prior to the emergency department staff called the hospitalist service for admission.  Past Medical History:  Diagnosis Date  . Allergy   . CHF (congestive heart failure) (HCC)   . Falls   . Hyperlipidemia   . Hypertension   . Osteoporosis   . Sleep apnea   . Thyroid disease     Past Surgical History:  Procedure Laterality Date  . APPENDECTOMY    . CATARACT EXTRACTION W/ INTRAOCULAR LENS  IMPLANT, BILATERAL Bilateral   . CHOLECYSTECTOMY    . HEMORRHOID SURGERY    . JOINT REPLACEMENT    . REPLACEMENT TOTAL KNEE Bilateral     Family History  Problem Relation Age of Onset  . Hypertension Mother   . Heart disease Father    Social History:  reports that she has never smoked. She has never used smokeless tobacco. She reports that she does not drink alcohol or use drugs.  Allergies:  Allergies  Allergen Reactions  . Norpramin [Desipramine Hcl]     unkown  reaction  . Sulfa Antibiotics     Red spots on skin  . Codeine Rash  . Latex Rash  . Penicillins Rash    Prior to Admission medications   Medication Sig Start Date End Date Taking? Authorizing  Provider  amLODipine (NORVASC) 5 MG tablet TAKE 1 BY MOUTH DAILY FOR BLOOD PRESSURE 07/13/14  Yes Historical Provider, MD  aspirin EC 81 MG tablet Take 81 mg by mouth daily.    Yes Historical Provider, MD  B Complex Vitamins (VITAMIN B COMPLEX) TABS Take by mouth daily.   Yes Historical Provider, MD  benazepril (LOTENSIN) 40 MG tablet TAKE 1 BY MOUTH DAILY 07/13/14  Yes Historical Provider, MD  celecoxib (CELEBREX) 200 MG capsule TAKE 1 BY MOUTH TWICE DAILY 07/13/14  Yes Historical Provider, MD  cilostazol (PLETAL) 100 MG tablet TAKE 1 BY MOUTH TWICE DAILY FOR CIRCULATION 07/13/14  Yes Historical Provider, MD  diclofenac sodium (VOLTAREN) 1 % GEL Apply 2 - 4g to painful area of skin 4 times per day if tolerated 08/06/15  Yes Gregory Crisp, MD  fluticasone (FLONASE) 50 MCG/ACT nasal spray 1 spray daily as needed for allergies or rhinitis. Reported on 07/09/2015   Yes Historical Provider, MD  furosemide (LASIX) 40 MG tablet Take 80 mg by mouth daily.  07/13/14  Yes Historical Provider, MD  gabapentin (NEURONTIN) 300 MG capsule Take 300 mg by mouth 2 (two) times daily.  07/13/14  Yes Historical Provider, MD  HYDROcodone-acetaminophen (NORCO/VICODIN) 5-325 MG tablet Limited 1 tab by mouth 2-4 times/day if tolerated Patient taking differently: Take 1 tablet by mouth every 6 (six) hours as needed.    12/02/15  Yes Mohammed Kindle, MD  ketoconazole (NIZORAL) 2 % shampoo Reported on 08/06/2015 11/30/13  Yes Historical Provider, MD  levothyroxine (SYNTHROID, LEVOTHROID) 50 MCG tablet Take 50 mcg by mouth daily before breakfast.  07/13/14  Yes Historical Provider, MD  loratadine (CLARITIN) 10 MG tablet Take 10 mg by mouth daily.   Yes Historical Provider, MD  metoprolol succinate (TOPROL-XL) 100 MG 24 hr tablet TAKE 1/2 BY MOUTH DAILY 07/13/14  Yes Historical Provider, MD  nystatin cream (MYCOSTATIN) Apply 1 application topically 2 (two) times daily.   Yes Historical Provider, MD  omeprazole (PRILOSEC) 20 MG capsule TAKE 1  BY MOUTH DAILY 07/13/14  Yes Historical Provider, MD  pravastatin (PRAVACHOL) 40 MG tablet TAKE 1 BY MOUTH EVERY NIGHT 07/13/14  Yes Historical Provider, MD  traMADol (ULTRAM) 50 MG tablet Limit one tab by mouth 1-2 times per day for breakthrough pain while taking hydrocodone acetaminophen if tolerated Patient taking differently: Take 50 mg by mouth every 8 (eight) hours as needed.  12/02/15  Yes Mohammed Kindle, MD     Results for orders placed or performed during the hospital encounter of 07/11/2016 (from the past 48 hour(s))  Lipase, blood     Status: None   Collection Time: 07/20/2016  4:33 AM  Result Value Ref Range   Lipase 17 11 - 51 U/L  Comprehensive metabolic panel     Status: Abnormal   Collection Time: 06/30/2016  4:33 AM  Result Value Ref Range   Sodium 142 135 - 145 mmol/L   Potassium 3.5 3.5 - 5.1 mmol/L   Chloride 107 101 - 111 mmol/L   CO2 22 22 - 32 mmol/L   Glucose, Bld 169 (H) 65 - 99 mg/dL   BUN 43 (H) 6 - 20 mg/dL   Creatinine, Ser 1.87 (H) 0.44 - 1.00 mg/dL   Calcium 8.9 8.9 - 10.3 mg/dL   Total Protein 7.0 6.5 - 8.1 g/dL   Albumin 3.7 3.5 - 5.0 g/dL   AST 36 15 - 41 U/L   ALT 18 14 - 54 U/L   Alkaline Phosphatase 148 (H) 38 - 126 U/L   Total Bilirubin 1.0 0.3 - 1.2 mg/dL   GFR calc non Af Amer 22 (L) >60 mL/min   GFR calc Af Amer 26 (L) >60 mL/min    Comment: (NOTE) The eGFR has been calculated using the CKD EPI equation. This calculation has not been validated in all clinical situations. eGFR's persistently <60 mL/min signify possible Chronic Kidney Disease.    Anion gap 13 5 - 15  CBC     Status: Abnormal   Collection Time: 07/06/2016  4:33 AM  Result Value Ref Range   WBC 24.8 (H) 3.6 - 11.0 K/uL   RBC 4.43 3.80 - 5.20 MIL/uL   Hemoglobin 13.8 12.0 - 16.0 g/dL   HCT 40.6 35.0 - 47.0 %   MCV 91.7 80.0 - 100.0 fL   MCH 31.2 26.0 - 34.0 pg   MCHC 34.0 32.0 - 36.0 g/dL   RDW 13.7 11.5 - 14.5 %   Platelets 217 150 - 440 K/uL  Urinalysis, Complete w  Microscopic     Status: Abnormal   Collection Time: 07/12/2016  4:33 AM  Result Value Ref Range   Color, Urine YELLOW (A) YELLOW   APPearance HAZY (A) CLEAR   Specific Gravity, Urine 1.010 1.005 - 1.030   pH 5.0 5.0 - 8.0   Glucose, UA NEGATIVE NEGATIVE mg/dL   Hgb urine dipstick NEGATIVE  NEGATIVE   Bilirubin Urine NEGATIVE NEGATIVE   Ketones, ur NEGATIVE NEGATIVE mg/dL   Protein, ur NEGATIVE NEGATIVE mg/dL   Nitrite NEGATIVE NEGATIVE   Leukocytes, UA NEGATIVE NEGATIVE   RBC / HPF 0-5 0 - 5 RBC/hpf   WBC, UA 0-5 0 - 5 WBC/hpf   Bacteria, UA RARE (A) NONE SEEN   Squamous Epithelial / LPF 0-5 (A) NONE SEEN   Mucous PRESENT    Hyaline Casts, UA PRESENT   Troponin I     Status: Abnormal   Collection Time: 06/22/2016  4:33 AM  Result Value Ref Range   Troponin I 0.05 (HH) <0.03 ng/mL    Comment: CRITICAL RESULT CALLED TO, READ BACK BY AND VERIFIED WITH MICHELE MORTON AT 9562 06/27/2016.PMH  Blood gas, arterial     Status: Abnormal   Collection Time: 07/11/2016  4:40 AM  Result Value Ref Range   FIO2 0.28    Delivery systems NASAL CANNULA    pH, Arterial 7.35 7.350 - 7.450   pCO2 arterial 38 32.0 - 48.0 mmHg   pO2, Arterial 65 (L) 83.0 - 108.0 mmHg   Bicarbonate 21.0 20.0 - 28.0 mmol/L   Acid-base deficit 4.2 (H) 0.0 - 2.0 mmol/L   O2 Saturation 91.3 %   Patient temperature 37.0    Collection site RIGHT RADIAL    Sample type ARTERIAL DRAW    Allens test (pass/fail) PASS PASS  Glucose, capillary     Status: Abnormal   Collection Time: 07/10/2016  4:44 AM  Result Value Ref Range   Glucose-Capillary 158 (H) 65 - 99 mg/dL   Dg Chest Port 1 View  Result Date: 07/08/2016 CLINICAL DATA:  83 year old female with abdominal pain. The patient experienced loss of consciousness and fall. EXAM: PORTABLE CHEST 1 VIEW COMPARISON:  Chest radiograph dated 06/25/2016 FINDINGS: COPD changes. Left lung base opacity may represent scarring or combination of pleural effusion and associated atelectasis/  infiltrate. The right lung is clear. There is no pneumothorax. There is mild cardiomegaly. There is atherosclerotic calcification of the aortic arch. No acute osseous pathology. IMPRESSION: Left lung base scarring versus small pleural effusion and associated atelectasis/ infiltrate. Stable mild cardiomegaly. Electronically Signed   By: Anner Crete M.D.   On: 06/29/2016 05:44    Review of Systems  Constitutional: Negative for chills and fever.  HENT: Negative for sore throat and tinnitus.   Eyes: Negative for blurred vision and redness.  Respiratory: Negative for cough and shortness of breath.   Cardiovascular: Negative for chest pain, palpitations, orthopnea and PND.  Gastrointestinal: Positive for abdominal pain, constipation and diarrhea. Negative for blood in stool, melena, nausea and vomiting.  Genitourinary: Negative for dysuria, frequency and urgency.  Musculoskeletal: Negative for joint pain and myalgias.  Skin: Negative for rash.       No lesions  Neurological: Positive for weakness. Negative for speech change and focal weakness.  Endo/Heme/Allergies: Does not bruise/bleed easily.       No temperature intolerance  Psychiatric/Behavioral: Negative for depression and suicidal ideas.    Blood pressure (!) 108/49, pulse 79, temperature 97.5 F (36.4 C), temperature source Oral, resp. rate (!) 26, height 5' 3" (1.6 m), weight 74.8 kg (165 lb), SpO2 97 %. Physical Exam  Vitals reviewed. Constitutional: She is oriented to person, place, and time. She appears well-developed and well-nourished. No distress.  HENT:  Head: Normocephalic and atraumatic.  Mouth/Throat: Oropharynx is clear and moist.  Eyes: Conjunctivae and EOM are normal. Pupils are equal, round, and  reactive to light. No scleral icterus.  Neck: Normal range of motion. Neck supple. No JVD present. No tracheal deviation present. No thyromegaly present.  Cardiovascular: Normal rate, regular rhythm and normal heart sounds.   Exam reveals no gallop and no friction rub.   No murmur heard. Respiratory: Effort normal and breath sounds normal.  GI: Soft. Bowel sounds are normal. She exhibits distension. There is no tenderness.  Genitourinary:  Genitourinary Comments: Deferred  Musculoskeletal: Normal range of motion. She exhibits no edema.  Lymphadenopathy:    She has no cervical adenopathy.  Neurological: She is alert and oriented to person, place, and time. No cranial nerve deficit. She exhibits normal muscle tone.  Skin: Skin is warm and dry. No rash noted. No erythema.  Psychiatric: She has a normal mood and affect. Her behavior is normal. Judgment and thought content normal.     Assessment/Plan This is a 83-year-old female admitted for sepsis. 1. Sepsis: The patient's criteria via tachypnea and leukocytosis. Source is pneumonia. Initially she was hypotensive but the patient is fluid responsive and has normalized her blood pressure following multiple boluses of normal saline. Blood cultures have been obtained which we will follow for growth and sensitivities. 2. Pneumonia: Community-acquired; continue ceftriaxone and azithromycin. Supplemental oxygen as needed. 3. Acute on chronic kidney failure: Secondary to dehydration and sepsis. Hydrate with intravenous fluid and avoid nephrotoxic agents. 4. Abdominal distention: KUB shows significant constipation. Diarrhea from before likely secondary to encoparesis around solid stool bulk. We'll hydrate the patient for now to help soften stool. Introduce stool softener and/or laxative once patient is stabilized and rehydrated. 5. Hypertension: Hold antihypertensive medications for now. I have scheduled the patient's metoprolol to resume tomorrow. Other agents to restart at the discretion of the treatment team. 6. CHF: Presumably systolic; stable. I scheduled the patient's Lasix to resume this evening to try to achieve and even fluid balance after current resuscitation. 7.  Hypothyroidism: Continue Synthroid 8. Hyperlipidemia: Continue statin therapy 9. DVT prophylaxis: None 10. GI prophylaxis: None The patient is a full code. Time spent on admission orders and patient care approximately 45 minutes  Diamond,  Michael S, MD 07/19/2016, 6:26 AM   

## 2016-07-13 NOTE — ED Provider Notes (Addendum)
[facility] Emergency Department Provider Note   ____________________________________________   First MD Initiated Contact with Patient 30-Jul-2016 0430     (approximate)  I have reviewed the triage vital signs and the nursing notes.   HISTORY  Chief Complaint Abdominal Pain and Loss of Consciousness  History limited by confusion  HPI Marisa [name] is a 83 y.o. female brought to the ED from home via EMS with a chief complaint of diarrhea, syncope and altered mentation.EMS was originally called out for a chief complaint of diarrhea; reportedly patient has not been constipated, took Senokot and has not been having diarrhea last evening and this morning. While EMS was there, patient slumped against the wall and slid to the floor. Had brief LOC. Patient is on 2 L continuous oxygen. Patient voices no complaints of pain, does complain of generalized weakness. Rest of history is unobtainable secondary to confusion. No family at bedside currently.   Past Medical History:  Diagnosis Date  . Allergy   . CHF (congestive heart failure) (HCC)   . Falls   . Hyperlipidemia   . Hypertension   . Osteoporosis   . Sleep apnea   . Thyroid disease     Patient Active Problem List   Diagnosis Date Noted  . Carbuncle and furuncle of buttock 09/30/2015  . DJD of shoulder 08/01/2015  . Rotator cuff injury 08/01/2015  . DDD (degenerative disc disease), lumbar 09/03/2014  . Spinal stenosis, lumbar region, with neurogenic claudication 09/03/2014  . Sacroiliac joint dysfunction 09/03/2014  . Carpal tunnel syndrome 09/03/2014    Past Surgical History:  Procedure Laterality Date  . APPENDECTOMY    . CATARACT EXTRACTION W/ INTRAOCULAR LENS  IMPLANT, BILATERAL Bilateral   . CHOLECYSTECTOMY    . HEMORRHOID SURGERY    . JOINT REPLACEMENT    . REPLACEMENT TOTAL KNEE Bilateral     Prior to Admission medications   Medication Sig Start Date End Date Taking? Authorizing  Provider  amLODipine (NORVASC) 5 MG tablet TAKE 1 BY MOUTH DAILY FOR BLOOD PRESSURE 07/13/14   Historical Provider, MD  aspirin EC 81 MG tablet Take 81 mg by mouth daily.     Historical Provider, MD  B Complex Vitamins (VITAMIN B COMPLEX) TABS Take by mouth daily.    Historical Provider, MD  benazepril (LOTENSIN) 40 MG tablet TAKE 1 BY MOUTH DAILY 07/13/14   Historical Provider, MD  celecoxib (CELEBREX) 200 MG capsule TAKE 1 BY MOUTH TWICE DAILY 07/13/14   Historical Provider, MD  cephALEXin (KEFLEX) 500 MG capsule Take 1 capsule (500 mg total) by mouth 3 (three) times daily. 06/25/16   Phineas Semen, MD  cilostazol (PLETAL) 100 MG tablet TAKE 1 BY MOUTH TWICE DAILY FOR CIRCULATION 07/13/14   Historical Provider, MD  diclofenac sodium (VOLTAREN) 1 % GEL Apply 2 - 4g to painful area of skin 4 times per day if tolerated 08/06/15   Ewing Schlein, MD  fluticasone [facility]) 50 MCG/ACT nasal spray 1 spray daily as needed for allergies or rhinitis. Reported on 07/09/2015    Historical Provider, MD  furosemide (LASIX) 40 MG tablet Take by mouth 2 (two) times daily.  07/13/14   Historical Provider, MD  gabapentin (NEURONTIN) 300 MG capsule Take 300 mg by mouth 2 (two) times daily.  07/13/14   Historical Provider, MD  HYDROcodone-acetaminophen (NORCO/VICODIN) 5-325 MG tablet Limited 1 tab by mouth 2-4 times/day if tolerated 12/02/15   Ewing Schlein, MD  ketoconazole (NIZORAL) 2 % shampoo Reported on 08/06/2015  11/30/13   Historical Provider, MD  levothyroxine (SYNTHROID, LEVOTHROID) 50 MCG tablet Take 50 mcg by mouth daily before breakfast.  07/13/14   Historical Provider, MD  loratadine (CLARITIN) 10 MG tablet Take 10 mg by mouth daily.    Historical Provider, MD  metoprolol succinate (TOPROL-XL) 100 MG 24 hr tablet TAKE 1/2 BY MOUTH DAILY 07/13/14   Historical Provider, MD  omeprazole (PRILOSEC) 20 MG capsule TAKE 1 BY MOUTH DAILY 07/13/14   Historical Provider, MD  pravastatin (PRAVACHOL) 40 MG tablet TAKE 1 BY MOUTH  EVERY NIGHT 07/13/14   Historical Provider, MD  traMADol (ULTRAM) 50 MG tablet Limit one tab by mouth 1-2 times per day for breakthrough pain while taking hydrocodone acetaminophen if tolerated 12/02/15   Ewing Schlein, MD    Allergies Norpramin [desipramine hcl]; Sulfa antibiotics; Codeine; Latex; and Penicillins  Family History  Problem Relation Age of Onset  . Hypertension Mother   . Heart disease Father     Social History Social History  Substance Use Topics  . Smoking status: Never Smoker  . Smokeless tobacco: Never Used  . Alcohol use No    Review of Systems  Constitutional: No fever/chills. Eyes: No visual changes. ENT: No sore throat. Cardiovascular: Denies chest pain. Respiratory: Denies shortness of breath. Gastrointestinal: No abdominal pain.  No nausea, no vomiting.  Positive for diarrhea and constipation. Genitourinary: Negative for dysuria. Musculoskeletal: Negative for back pain. Skin: Negative for rash. Neurological: Positive for syncope. Negative for headaches, focal weakness or numbness.  10-point ROS otherwise negative.  ____________________________________________   PHYSICAL EXAM:  VITAL SIGNS: ED Triage Vitals [2016/08/07 0420]  Enc Vitals Group     BP      Pulse      Resp      Temp      Temp src      SpO2      Weight 165 lb (74.8 kg)     Height  (1.6 m)     Head Circumference      Peak Flow      Pain Score      Pain Loc      Pain Edu?      Excl. in GC?     Constitutional: Somnolent but arousable to voice. Elderly appearing and in mild acute distress. Eyes: Conjunctivae are normal. PERRL. EOMI. Head: Atraumatic. Nose: No congestion/rhinnorhea. Mouth/Throat: Mucous membranes are moist.  Oropharynx non-erythematous. Neck: No stridor.  No cervical spine tenderness to palpation. Cardiovascular: Normal rate, regular rhythm. Grossly normal heart sounds.  Good peripheral circulation. Respiratory: Normal respiratory effort.  No  retractions. Lungs CTAB. Gastrointestinal: Soft and nontender to light or deep palpation. No distention. No abdominal bruits. No CVA tenderness. Musculoskeletal: No lower extremity tenderness nor edema.  No joint effusions. Neurologic:  Somnolent but arousable to voice. Confused. Follows simple commands. MAE 4.  Skin:  Skin is warm, dry and intact. No rash noted. No petechiae. Psychiatric: Mood and affect are normal. Speech and behavior are normal.  ____________________________________________   LABS (all labs ordered are listed, but only abnormal results are displayed)  Labs Reviewed  COMPREHENSIVE METABOLIC PANEL - Abnormal; Notable for the following:       Result Value   Glucose, Bld 169 (*)    BUN 43 (*)    Creatinine, Ser 1.87 (*)    Alkaline Phosphatase 148 (*)    GFR calc non Af Amer 22 (*)    GFR calc Af Amer 26 (*)    All  other components within normal limits  CBC - Abnormal; Notable for the following:    WBC 24.8 (*)    All other components within normal limits  URINALYSIS, COMPLETE (UACMP) WITH MICROSCOPIC - Abnormal; Notable for the following:    Color, Urine YELLOW (*)    APPearance HAZY (*)    Bacteria, UA RARE (*)    Squamous Epithelial / LPF 0-5 (*)    All other components within normal limits  TROPONIN I - Abnormal; Notable for the following:    Troponin I 0.05 (*)    All other components within normal limits  LACTIC ACID, PLASMA - Abnormal; Notable for the following:    Lactic Acid, Venous 2.9 (*)    All other components within normal limits  BLOOD GAS, ARTERIAL - Abnormal; Notable for the following:    pO2, Arterial 65 (*)    Acid-base deficit 4.2 (*)    All other components within normal limits  GLUCOSE, CAPILLARY - Abnormal; Notable for the following:    Glucose-Capillary 158 (*)    All other components within normal limits  CULTURE, BLOOD (ROUTINE X 2)  CULTURE, BLOOD (ROUTINE X 2)  LIPASE, BLOOD  LACTIC ACID, PLASMA  CBG MONITORING, ED    ____________________________________________  EKG  ED ECG REPORT I, Talar Fraley J, the attending physician, personally viewed and interpreted this ECG.   Date: 07/11/2016  EKG Time: 0425  Rate: 84  Rhythm: normal EKG, normal sinus rhythm  Axis: LAD  Intervals:right bundle branch block; LAFB  ST&T Change: Nonspecific  ____________________________________________  RADIOLOGY  Portable chest x-ray interpreted per Dr. Gwenyth Bender: Left lung base scarring versus small pleural effusion and associated  atelectasis/ infiltrate.    Stable mild cardiomegaly.   ____________________________________________   PROCEDURES  Procedure(s) performed: None  Procedures  Critical Care performed: Yes, see critical care note(s)   CRITICAL CARE Performed by: Irean Hong   Total critical care time: 45 minutes  Critical care time was exclusive of separately billable procedures and treating other patients.  Critical care was necessary to treat or prevent imminent or life-threatening deterioration.  Critical care was time spent personally by me on the following activities: development of treatment plan with patient and/or surrogate as well as nursing, discussions with consultants, evaluation of patient's response to treatment, examination of patient, obtaining history from patient or surrogate, ordering and performing treatments and interventions, ordering and review of laboratory studies, ordering and review of radiographic studies, pulse oximetry and re-evaluation of patient's condition.  ____________________________________________   INITIAL IMPRESSION / ASSESSMENT AND PLAN / ED COURSE  Pertinent labs & imaging results that were available during my care of the patient were reviewed by me and considered in my medical decision making (see chart for details).  83 year old female who presents for diarrhea after taking stool softener to relieve constipation. EMS reports syncopal episode at  home. Patient is on continuous oxygen via nasal cannula for chronic respiratory failure. Per records, patient had UTI 3 weeks ago and was treated with antibiotics. She followed up with her PCP on 4/18 with negative urinalysis. It is also noted that she was started on narcotics for orthopedic pain. Will obtain screening lab work, obtain imaging studies of head and abdomen, and reassess.  Clinical Course as of Jul 14 639  Mon Jul 13, 2016  1610 Daughter at bedside who tells me patient has been taking more hydrocodone this week for nontraumatic hip pain. It is possible that patient may be over narcotized. However, I do not feel  administer Narcan is prudent as I do not want patient to vomit and aspirate. Will continue IV fluids for hypotension, IV antibiotics ordered for community-acquired pneumonia. Discussed with hospitalist evaluate patient in the emergency department for admission. She will proceed to CT scan once her blood pressure stabilized.  [JS]    Clinical Course User Index [JS] Irean Hong, MD     ____________________________________________   FINAL CLINICAL IMPRESSION(S) / ED DIAGNOSES  Final diagnoses:  Confusion  Community acquired pneumonia, unspecified laterality  Diarrhea, unspecified type  Dehydration  Sepsis, due to unspecified organism (HCC)  Elevated troponin  Hypotension, unspecified hypotension type      NEW MEDICATIONS STARTED DURING THIS VISIT:  New Prescriptions   No medications on file     Note:  This document was prepared using Dragon voice recognition software and may include unintentional dictation errors.    Irean Hong, MD 07/29/16 7829    Irean Hong, MD 07/29/16 [phone]

## 2016-07-13 NOTE — Progress Notes (Addendum)
[facility] - [place] at [facility]   PATIENT NAME: Marisa [name]    MR#:  [id number]  DATE OF BIRTH:  [birth year]  SUBJECTIVE:  Came in with increasing diarrhea and found to have colitis and low bp Pt lethargic dter in the room give most history  REVIEW OF SYSTEMS:   Review of Systems  Unable to perform ROS: Medical condition   Tolerating Diet:npo Tolerating PT:   DRUG ALLERGIES:   Allergies  Allergen Reactions  . Norpramin [Desipramine Hcl]     unkown  reaction  . Sulfa Antibiotics     Red spots on skin  . Codeine Rash  . Latex Rash  . Penicillins Rash    VITALS:  Blood pressure 98/74, pulse 72, temperature 97.7 F (36.5 C), temperature source Oral, resp. rate 18, height  (1.6 m), weight 74.8 kg (165 lb), SpO2 100 %.  PHYSICAL EXAMINATION:   Physical Exam  GENERAL:  83 y.o.-year-old patient lying in the bed with no acute distress. Looks ill EYES: Pupils equal, round, reactive to light and accommodation. No scleral icterus. Extraocular muscles intact.  HEENT: Head atraumatic, normocephalic. Oropharynx and nasopharynx clear.  NECK:  Supple, no jugular venous distention. No thyroid enlargement, no tenderness.  LUNGS: distinatbreath sounds bilaterally, no wheezing, rales, rhonchi. No use of accessory muscles of respiration.  CARDIOVASCULAR: S1, S2 normal. No murmurs, rubs, or gallops.  ABDOMEN: Soft, nontender, nondistended. Bowel sounds present. No organomegaly or mass.  EXTREMITIES: No cyanosis, clubbing or edema b/l.    NEUROLOGIC: moves all extremities well spontaneously.  PSYCHIATRIC:  patient is fatigued and lethargic  uSKIN: No obvious rash, lesion, or ulcer.   LABORATORY PANEL:  CBC  Recent Labs Lab Jul 30, 2016 0433  WBC 24.8*  HGB 13.8  HCT 40.6  PLT 217    Chemistries   Recent Labs Lab Jul 30, 2016 0433  NA 142  K 3.5  CL 107  CO2 22  GLUCOSE 169*  BUN 43*  CREATININE 1.87*  CALCIUM 8.9  AST 36  ALT 18   ALKPHOS 148*  BILITOT 1.0   Cardiac Enzymes  Recent Labs Lab 07/30/16 0433  TROPONINI 0.05*   RADIOLOGY:  Ct Abdomen Pelvis Wo Contrast  Result Date: 30-Jul-2016 CLINICAL DATA:  Diarrhea.  Syncope.  Altered mental status. EXAM: CT ABDOMEN AND PELVIS WITHOUT CONTRAST TECHNIQUE: Multidetector CT imaging of the abdomen and pelvis was performed following the standard protocol without IV contrast. COMPARISON:  Plain films of earlier today. FINDINGS: Mild degradation secondary to motion, EKG leads/ wires, and lack of oral or intravenous contrast. Lower chest: Left infrahilar volume loss. Moderate cardiomegaly, without pericardial effusion. Multivessel coronary artery atherosclerosis. Small hiatal hernia. Hepatobiliary: Normal liver. Cholecystectomy, without biliary ductal dilatation. Pancreas: Normal pancreas for age, with mild-to-moderate atrophy. Spleen: Normal in size, without focal abnormality. Adrenals/Urinary Tract: Normal adrenal glands. Mild renal cortical thinning bilaterally. No renal calculi or hydronephrosis. No hydroureter or ureteric calculi. No bladder calculi. Stomach/Bowel: Normal remainder of the stomach. Moderate amount of stool in the rectum. Wall thickening of and edema surrounding the sigmoid and rectum. Fluid-filled colon proximal to the rectum. The cecum is redundant, positioned in the left central pelvis. Normal terminal ileum. Appendix not visualized but no pericecal inflammation is seen. Normal small bowel. Vascular/Lymphatic: Advanced aortic and branch vessel atherosclerosis. Non aneurysmal infrarenal aortic dilatation at 2.5 cm. No abdominopelvic adenopathy. Reproductive: Normal uterus and adnexa. Other: No significant free fluid. A tiny fat containing right inguinal hernia. Musculoskeletal: Convex left lumbar spine curvature.  Advanced lumbosacral spondylosis with grade 1 L5-S1 anterolisthesis IMPRESSION: 1. Inflammation involving the colon from the sigmoid distally.  Considerations include infection (exclude C difficile clinically) and stercoral colitis (given moderate stool ball in the rectum). 2. Multifactorial degradation. 3.  Coronary artery atherosclerosis. Aortic atherosclerosis. 4. Small hiatal hernia. Electronically Signed   By: Jeronimo Greaves M.D.   On: 2016-08-04 08:43   Dg Abd 1 View  Result Date: 2016/08/04 CLINICAL DATA:  Generalized abdominal pain and constipation. EXAM: ABDOMEN - 1 VIEW COMPARISON:  None. FINDINGS: A small amount of gas is present in nondilated bowel loops in the lower abdomen. No dilated loops of bowel are seen to suggest obstruction. No gross intraperitoneal free air is identified on this supine study. Cholecystectomy clips, vascular calcifications, and S-shaped thoracolumbar scoliosis with disc degeneration are noted. Left lung base opacities are more fully evaluated on today's earlier chest radiograph. IMPRESSION: No evidence of bowel obstruction. Electronically Signed   By: Sebastian Ache M.D.   On: Aug 04, 2016 07:42   Ct Head Wo Contrast  Result Date: 2016-08-04 CLINICAL DATA:  Diarrhea, syncope, altered mentation.Generalized weakness. Confusion. EXAM: CT HEAD WITHOUT CONTRAST TECHNIQUE: Contiguous axial images were obtained from the base of the skull through the vertex without intravenous contrast. COMPARISON:  12/23/2011. FINDINGS: Brain: No evidence for acute infarction, hemorrhage, mass lesion, hydrocephalus, or extra-axial fluid. Mild atrophy, not unexpected for age. Hypoattenuation of white matter consistent with small vessel disease. Vascular: Advanced vascular calcification of the internal carotid arteries at the skullbase as well as the distal vertebral arteries. No signs of large vessel occlusion. Skull: Normal. Negative for fracture or focal lesion. Sinuses/Orbits: No acute finding. Other: None. IMPRESSION: Atrophy and small vessel disease, similar to priors. No acute intracranial findings. Electronically Signed   By: Elsie Stain M.D.   On: 08/04/2016 08:37   Dg Chest Port 1 View  Result Date: 04-Aug-2016 CLINICAL DATA:  83 year old female with abdominal pain. The patient experienced loss of consciousness and fall. EXAM: PORTABLE CHEST 1 VIEW COMPARISON:  Chest radiograph dated 06/25/2016 FINDINGS: COPD changes. Left lung base opacity may represent scarring or combination of pleural effusion and associated atelectasis/ infiltrate. The right lung is clear. There is no pneumothorax. There is mild cardiomegaly. There is atherosclerotic calcification of the aortic arch. No acute osseous pathology. IMPRESSION: Left lung base scarring versus small pleural effusion and associated atelectasis/ infiltrate. Stable mild cardiomegaly. Electronically Signed   By: Elgie Collard M.D.   On: 08/04/2016 05:44   ASSESSMENT AND PLAN:  83 year old female admitted for sepsis.  1. Sepsis:  patient's criteria via tachypnea and leukocytosis. Source is Colitis -Initially she was hypotensive but the patient is fluid responsive and has normalized her blood pressure following multiple boluses of normal saline. Pt's condition declined and was transferred to the ICU for Sepsis, respiratory failure, acute renal failure  -f/u Blood cultures negative -Patient had a CT chest of abdomen and pelvis and lungs appear clear there is some atelectasis and mild pleural effusion or evidence of consolidation/pneumonia -now on IV vanc and zosyn  2. Colitis as noted on CT abdomen  - patient recently took out about a course for UTI about a week ago per daughter  - she also got constipated and received senna and Dulcolax suppository yesterday thereafter she started having multiple diarrheal stools hence I will not be able to send C. difficile stool studies   3. Acute on chronic kidney failure: Secondary to dehydration and sepsis.  -Hydrate with intravenous fluid and  avoid nephrotoxic agents. -creatinine rising and no UOP  4.  Hypovolemic shock with metabolic  acidosis -IV pressors and IVF.   5. Hypothyroidism: Continue Synthroid  6. Hyperlipidemia: Continue statin therapy  7. DVT prophylaxis: Lovenox  Overall appears to have a poor prognosis Spoke at length with detr. Awaiting for other siblings to arrive. She understands pt is likely not going to survive hospitalization.  Case discussed with Care Management/Social Worker. Management plans discussed with the family and they are in agreement.  CODE STATUS: DNR (per discussion by NP with family lasit nite)  DVT Prophylaxis: lovenox  TOTAL TIME TAKING CARE OF THIS PATIENT: *30* minutes.  >50% time spent on counselling and coordination of care  Note: This dictation was prepared with Dragon dictation along with smaller phrase technology. Any transcriptional errors that result from this process are unintentional.  Bhargav Barbaro M.D on Jul 26, 2016 at 11:10 AM  Between 7am to 6pm - Pager - [phone]  After 6pm go to [email or web address] - Social research officer, government  [facility]  Office  [phone]  CC: Primary care physician; Rafael Bihari, MD

## 2016-07-13 NOTE — Progress Notes (Signed)
ABG drawn. SVN given. Pt. Very somnolent. With somewhat labored respirations. Pt. Placed on BIPAP per Dr. Anne Hahn. Pending transfer to ICU.

## 2016-07-14 ENCOUNTER — Inpatient Hospital Stay: Payer: Medicare Other

## 2016-07-14 DIAGNOSIS — A419 Sepsis, unspecified organism: Principal | ICD-10-CM

## 2016-07-14 DIAGNOSIS — J9601 Acute respiratory failure with hypoxia: Secondary | ICD-10-CM

## 2016-07-14 LAB — CBC WITH DIFFERENTIAL/PLATELET
BASOS ABS: 0 10*3/uL (ref 0–0.1)
Basophils Relative: 0 %
Eosinophils Absolute: 0 10*3/uL (ref 0–0.7)
Eosinophils Relative: 0 %
HEMATOCRIT: 35.1 % (ref 35.0–47.0)
HEMOGLOBIN: 11.7 g/dL — AB (ref 12.0–16.0)
LYMPHS ABS: 1.2 10*3/uL (ref 1.0–3.6)
Lymphocytes Relative: 6 %
MCH: 30.6 pg (ref 26.0–34.0)
MCHC: 33.4 g/dL (ref 32.0–36.0)
MCV: 91.8 fL (ref 80.0–100.0)
Monocytes Absolute: 0.8 10*3/uL (ref 0.2–0.9)
Monocytes Relative: 4 %
NEUTROS ABS: 18.3 10*3/uL — AB (ref 1.4–6.5)
Neutrophils Relative %: 90 %
Platelets: 190 10*3/uL (ref 150–440)
RBC: 3.82 MIL/uL (ref 3.80–5.20)
RDW: 14.2 % (ref 11.5–14.5)
WBC: 20.2 10*3/uL — AB (ref 3.6–11.0)

## 2016-07-14 LAB — GASTROINTESTINAL PANEL BY PCR, STOOL (REPLACES STOOL CULTURE)
ADENOVIRUS F40/41: NOT DETECTED
ASTROVIRUS: NOT DETECTED
CRYPTOSPORIDIUM: NOT DETECTED
CYCLOSPORA CAYETANENSIS: NOT DETECTED
Campylobacter species: NOT DETECTED
ENTEROPATHOGENIC E COLI (EPEC): NOT DETECTED
ENTEROTOXIGENIC E COLI (ETEC): NOT DETECTED
Entamoeba histolytica: NOT DETECTED
Enteroaggregative E coli (EAEC): NOT DETECTED
GIARDIA LAMBLIA: NOT DETECTED
Norovirus GI/GII: NOT DETECTED
Plesimonas shigelloides: NOT DETECTED
Rotavirus A: NOT DETECTED
Salmonella species: NOT DETECTED
Sapovirus (I, II, IV, and V): NOT DETECTED
Shiga like toxin producing E coli (STEC): NOT DETECTED
Shigella/Enteroinvasive E coli (EIEC): NOT DETECTED
VIBRIO CHOLERAE: NOT DETECTED
VIBRIO SPECIES: NOT DETECTED
YERSINIA ENTEROCOLITICA: NOT DETECTED

## 2016-07-14 LAB — LACTIC ACID, PLASMA: Lactic Acid, Venous: 6.8 mmol/L (ref 0.5–1.9)

## 2016-07-14 LAB — GLUCOSE, CAPILLARY
Glucose-Capillary: 128 mg/dL — ABNORMAL HIGH (ref 65–99)
Glucose-Capillary: 147 mg/dL — ABNORMAL HIGH (ref 65–99)
Glucose-Capillary: 128 mg/dL — ABNORMAL HIGH (ref 65–99)
Glucose-Capillary: 105 mg/dL — ABNORMAL HIGH (ref 65–99)
Glucose-Capillary: 140 mg/dL — ABNORMAL HIGH (ref 65–99)
Glucose-Capillary: 156 mg/dL — ABNORMAL HIGH (ref 65–99)
Glucose-Capillary: 189 mg/dL — ABNORMAL HIGH (ref 65–99)
Glucose-Capillary: 195 mg/dL — ABNORMAL HIGH (ref 65–99)
Glucose-Capillary: 185 mg/dL — ABNORMAL HIGH (ref 65–99)
Glucose-Capillary: 192 mg/dL — ABNORMAL HIGH (ref 65–99)

## 2016-07-14 LAB — C DIFFICILE QUICK SCREEN W PCR REFLEX
C DIFFICILE (CDIFF) INTERP: NOT DETECTED
C DIFFICILE (CDIFF) TOXIN: NEGATIVE
C Diff antigen: NEGATIVE

## 2016-07-14 LAB — TROPONIN I
Troponin I: 0.08 ng/mL (ref <0.03)
Troponin I: 0.12 ng/mL (ref <0.03)

## 2016-07-14 LAB — BASIC METABOLIC PANEL
ANION GAP: 19 — AB (ref 5–15)
BUN: 58 mg/dL — ABNORMAL HIGH (ref 6–20)
CO2: 18 mmol/L — ABNORMAL LOW (ref 22–32)
Calcium: 6.9 mg/dL — ABNORMAL LOW (ref 8.9–10.3)
Chloride: 109 mmol/L (ref 101–111)
Creatinine, Ser: 2.89 mg/dL — ABNORMAL HIGH (ref 0.44–1.00)
GFR calc Af Amer: 15 mL/min — ABNORMAL LOW (ref >60)
GFR, EST NON AFRICAN AMERICAN: 13 mL/min — AB (ref >60)
Glucose, Bld: 158 mg/dL — ABNORMAL HIGH (ref 65–99)
Potassium: 3.6 mmol/L (ref 3.5–5.1)
Sodium: 146 mmol/L — ABNORMAL HIGH (ref 135–145)

## 2016-07-14 LAB — MRSA PCR SCREENING: MRSA by PCR: POSITIVE — AB

## 2016-07-14 LAB — BLOOD GAS, ARTERIAL
ACID-BASE DEFICIT: 7.6 mmol/L — AB (ref 0.0–2.0)
Bicarbonate: 18.2 mmol/L — ABNORMAL LOW (ref 20.0–28.0)
DELIVERY SYSTEMS: POSITIVE
Expiratory PAP: 6
FIO2: 1
Inspiratory PAP: 12
O2 Saturation: 99.6 %
PATIENT TEMPERATURE: 37
PCO2 ART: 37 mmHg (ref 32.0–48.0)
pH, Arterial: 7.3 — ABNORMAL LOW (ref 7.350–7.450)
pO2, Arterial: 199 mmHg — ABNORMAL HIGH (ref 83.0–108.0)

## 2016-07-14 LAB — VANCOMYCIN, RANDOM: VANCOMYCIN RM: 36

## 2016-07-14 LAB — HEMOGLOBIN A1C
Hgb A1c MFr Bld: 5.5 % (ref 4.8–5.6)
Mean Plasma Glucose: 111 mg/dL

## 2016-07-14 LAB — PROCALCITONIN: PROCALCITONIN: 101.03 ng/mL

## 2016-07-14 LAB — AMMONIA: Ammonia: 44 umol/L — ABNORMAL HIGH (ref 9–35)

## 2016-07-14 MED ORDER — HEPARIN SODIUM (PORCINE) 5000 UNIT/ML IJ SOLN: 5000.0000 [IU] | Freq: Three times a day (TID) | INTRAMUSCULAR | Status: DC

## 2016-07-14 MED ORDER — MUPIROCIN 2 % EX OINT
1.0000 "application " | TOPICAL_OINTMENT | Freq: Two times a day (BID) | CUTANEOUS | Status: DC
  Administered 2016-07-14 (×2): 1 via NASAL
  Filled 2016-07-14: qty 22

## 2016-07-14 MED ORDER — MORPHINE 100MG IN NS 100ML (1MG/ML) PREMIX INFUSION
5.0000 mg/h | INTRAVENOUS | Status: DC
  Administered 2016-07-14: 10 mg/h via INTRAVENOUS
  Filled 2016-07-14: qty 100

## 2016-07-14 MED ORDER — FENTANYL CITRATE (PF) 100 MCG/2ML IJ SOLN
50.0000 ug | INTRAMUSCULAR | Status: DC | PRN
  Administered 2016-07-14: 50 ug via INTRAVENOUS
  Filled 2016-07-14: qty 2

## 2016-07-14 MED ORDER — CHLORHEXIDINE GLUCONATE CLOTH 2 % EX PADS
6.0000 | MEDICATED_PAD | Freq: Every day | CUTANEOUS | Status: DC
  Administered 2016-07-14: 6 via TOPICAL

## 2016-07-14 MED ORDER — SODIUM CHLORIDE 0.9 % IV SOLN: 5.0000 mg/h | INTRAVENOUS | Status: DC

## 2016-07-14 MED ORDER — DEXTROSE 5 % IV SOLN
0.0000 ug/min | INTRAVENOUS | Status: DC
  Administered 2016-07-14: 18 ug/min via INTRAVENOUS
  Administered 2016-07-14: 8 ug/min via INTRAVENOUS
  Filled 2016-07-14: qty 16

## 2016-07-14 MED ORDER — SODIUM BICARBONATE 8.4 % IV SOLN
Freq: Once | INTRAVENOUS | Status: AC
  Administered 2016-07-14: 11:00:00 via INTRAVENOUS
  Filled 2016-07-14: qty 425

## 2016-07-16 ENCOUNTER — Telehealth: Payer: Self-pay | Admitting: Internal Medicine

## 2016-07-16 NOTE — Telephone Encounter (Signed)
Death cert given to DK to fill out. 

## 2016-07-16 NOTE — Telephone Encounter (Signed)
Informed [employer] that death cert is ready for pickup. Placed up front. Nothing further needed.

## 2016-07-16 NOTE — Telephone Encounter (Signed)
Received death certificate from [employer] .  Placed in lbpu box.

## 2016-07-18 LAB — CULTURE, BLOOD (ROUTINE X 2)
Culture: NO GROWTH
Culture: NO GROWTH
Special Requests: ADEQUATE

## 2016-07-21 NOTE — Progress Notes (Signed)
I spoke with pts 2 daughters and 1 son regarding decline in pts condition.  Informed pts family she is in multiorgan failure and minimally responsive.  Discussed goals of treatment and code status. Pts children state she is a DO NOT RESUSCITATE according to her living will, and would not want CPR, defibrillation, ACLS or mechanical intubation. Therefore, they would like her code status changed from Full Code to DNR orders placed for DNR and RN may pronounce 06/23/2016 at 23:53.  [name], AGNP  Pulmonary/Critical Care Pager [phone] (please enter 7 digits) PCCM Consult Pager [phone] (please enter 7 digits)

## 2016-07-21 NOTE — Progress Notes (Signed)
Late note. While cleaning patient from Stool, removed BiPAP x 5 minutes.Placed on 6 liters nasal cannula. Patient mumbling incoherently. Unable to follow command. Tremors and uncontrollable muscle movements and uncontrollable rhythmic muscle  Jerking. noted in arms and face.Dr. Ardyth Man notified and observed muscle movements. No orders received.

## 2016-07-21 NOTE — Progress Notes (Signed)
Patient is very difficult to arouse for an assessment. Patient will not open eyes to verbal or physical contact. Patient does not open her eyes even upon painful stimuli.  Patient had various exams throughout the day. This morning the patient would respond to my request for her to squeeze my hands. She had an equally weak but present squeeze to both of my hands.  Patient was also able to show me that she could wiggle her toes when I asked her. She also nodded her head "no" when I asked if she was having pain.  She remained on Bi-Pap throughout the day. Her noon assessment was much worse. She would not follow commands and she had began to have a type of tremor-like movement of her upper and lower extremities.  It did not look like a seizure but almost like a parkinson-like tremor.Dr. Ardyth Man was present and notified of this new finding and no new orders were received.  Patient continued with this type of tremors for several hours.  During this entire day we were working on getting her blood pressure stabilized with Levo.  We started the shift with her on .  We were able to get her off of it completely but then she dropped very low so we had to put her up on a high dose and were working to get her to a stable level. Her BP did not seem to be matching the doses and the readings because they were incredibly high or incredibly low. Currently her BP cuff is on her right lower arm. We have her on 5 mcg at the present time. Her highest dose rate was 25 mcg of Levo. Her daughter and family have been at the bedside for most of the day and have spoken with the doctor regarding the dismal future prospects they feel she has. Safety being maintained. Will monitor.

## 2016-07-21 NOTE — Procedures (Signed)
Central Venous Catheter Insertion Procedure Note QAMAR ROSMAN [id number] [birth year]  Procedure: Insertion of Central Venous Catheter Indications: Assessment of intravascular volume, Drug and/or fluid administration and Frequent blood sampling  Procedure Details Consent: Risks of procedure as well as the alternatives and risks of each were explained to the (patient/caregiver).  Consent for procedure obtained. Time Out: Verified patient identification, verified procedure, site/side was marked, verified correct patient position, special equipment/implants available, medications/allergies/relevent history reviewed, required imaging and test results available.  Performed  Maximum sterile technique was used including antiseptics, cap, gloves, gown, hand hygiene, mask and sheet. Skin prep: Chlorhexidine; local anesthetic administered A antimicrobial bonded/coated triple lumen catheter was placed in the right internal jugular vein using the Seldinger technique.  Evaluation Blood flow good Complications: No apparent complications Patient did tolerate procedure well. Chest X-ray ordered to verify placement.  CXR: pending.  Right internal jugular central line placed utilized utilizing ultrasound no complications noted during or following procedure.  [name], AGNP  Pulmonary/Critical Care Pager [phone] (please enter 7 digits) PCCM Consult Pager [phone] (please enter 7 digits)

## 2016-07-21 NOTE — Progress Notes (Signed)
 removed. Patient on room air.

## 2016-07-21 NOTE — Progress Notes (Signed)
Pharmacy Antibiotic Note  Marisa [name] is a 83 y.o. female admitted on 07/12/2016 with pneumonia.  Pharmacy has been consulted for vanc/zosyn dosing.  Plan: Will give patient vanc 1.5g IV x 1   Patient appears to be in AKI; will check VR 0500 and dose based off random levels Will initiate zosyn 3.375g IV q12h based on CrCl < 20 ml/min. Ke 0.0155 t1/2 44 hours  4/24 @ 0500 VR 36. Will check another VR 4/25 w/ am labs and will redose w/ a 15 mg/kg dose once VR < 20 mcg/mL.  Height:  (160 cm) Weight: 165 lb (74.8 kg) IBW/kg (Calculated) : 52.4  Temp (24hrs), Avg:98 F (36.7 C), Min:97.7 F (36.5 C), Max:98.3 F (36.8 C)   Recent Labs Lab 06/29/2016 0433 07/01/2016 0534 07/16/2016 0734 07/04/2016 2127 Aug 07, 2016 0458  WBC 24.8*  --   --  26.9* 20.2*  CREATININE 1.87*  --   --  2.66*  --   LATICACIDVEN  --  2.9* 2.2*  --  6.8*  VANCORANDOM  --   --   --   --  36    Estimated Creatinine Clearance: 13.4 mL/min (A) (by C-G formula based on SCr of 2.66 mg/dL (H)).    Allergies  Allergen Reactions  . Norpramin [Desipramine Hcl]     unkown  reaction  . Sulfa Antibiotics     Red spots on skin  . Codeine Rash  . Latex Rash  . Penicillins Rash    Thank you for allowing pharmacy to be a part of this patient's care.  Thomasene Ripple, PharmD, BCPS Clinical Pharmacist 08-07-16

## 2016-07-21 NOTE — Progress Notes (Signed)
PT Cancellation Note  Patient Details Name: Marisa [name] MRN: [id number] DOB: 1923/08/02   Cancelled Treatment:    Reason Eval/Treat Not Completed: Medical issues which prohibited therapy (Per continued chart review, patient noted with transfer to CCU due to acute respiratory failure, metabolic encephalopathy (requiring BiPAP and levophed currently).  Not appropriate for PT services at current time.  Will complete current order due to decline in status; please reconsult as medically appropriate.  Of note, patient noted with change in code status to DNR.)   Joevon Holliman H. Manson Passey, PT, DPT, NCS 07/07/2016, 10:47 AM [phone]

## 2016-07-21 NOTE — Progress Notes (Signed)
I met with family(5 children and 1 daughter in law) and have updated them about patients poor clinical status. She has multiorgan failure and is suffering and struggling to breathe. They all are in agreement that she is dying and they have agreed and consented comfort care measures once the rest of the family arrives.  Plan relayed to ICU nurse and NP.   Family are satisfied with Plan of action and management. All questions answered  Corrin Parker, M.D.  [facility]  Medical Director [place] Director [facility] Cardio-Pulmonary Department

## 2016-07-21 NOTE — Procedures (Signed)
Central Venous Catheter Insertion Procedure Note Marisa [name] [id number] [birth year]  Procedure: Insertion of Central Venous Catheter Indications: Assessment of intravascular volume, Drug and/or fluid administration and Frequent blood sampling  Procedure Details Consent: Risks of procedure as well as the alternatives and risks of each were explained to the (patient/caregiver).  Consent for procedure obtained. Time Out: Verified patient identification, verified procedure, site/side was marked, verified correct patient position, special equipment/implants available, medications/allergies/relevent history reviewed, required imaging and test results available.  Performed  Maximum sterile technique was used including antiseptics, cap, gloves, gown, hand hygiene, mask and sheet. Skin prep: Chlorhexidine; local anesthetic administered A antimicrobial bonded/coated triple lumen catheter was placed in the right femoral vein due to multiple attempts, no other available access using the Seldinger technique.  Evaluation Blood flow good Complications: No apparent complications Patient did tolerate procedure well. Chest X-ray ordered to verify placement.  CXR: not indicated .  Right femoral cental line placed utilizing ultrasound no complications noted during or following procedure; placed right internal jugular central line, however had to remove due to central line placement in the subclavian vein.  Marisa [name], AGNP  Pulmonary/Critical Care Pager [phone] (please enter 7 digits) PCCM Consult Pager [phone] (please enter 7 digits)

## 2016-07-21 NOTE — Progress Notes (Signed)
Time of death 2250/07/29.

## 2016-07-21 NOTE — Death Summary Note (Signed)
DEATH SUMMARY   Patient Details  Name: Marisa [name] MRN: [id number] DOB: [birth year]  Admission/Discharge Information   Admit Date:  2016-07-26  Date of Death: Date of Death: 07-27-2016  Time of Death: Time of Death: 08/07/50  Length of Stay: 1  Referring Physician: Rafael Bihari, MD     Diagnoses  Preliminary cause of death: Severe Septic Shock secondary to ischemic Colitis. Secondary Diagnoses (including complications and co-morbidities):  Active Problems:   Sepsis (HCC) Acute respiratory failure secondary to metabolic acidosis  Metabolic acidosis secondary to diarrhea due to colitis  Septic and Hypovolemic shock secondary to colitis  Metabolic Encephalopathy Lactic acidosis  Acute on chronic renal failure secondary to hypovolemia  Hypoglycemia   Brief Hospital Course (including significant findings, care, treatment, and services provided and events leading to death)  Marisa [name] is a 83 y.o. year old female who was  a 83 yo female with a PMH of Thyroid Disease, OSA, Osteoporosis, HTN, Hyperlipidemia, Falls, CHF, and Allergies.  She presented to [facility] ER 27-Jul-2022 with c/o weakness and episodes of diarrhea following use of laxatives onset today 27-Jul-2022. According to pts daughter the pt completed a course of abx 04/16 for treatment of a UTI.  Per ER notes according to pts daughter the evening of 07/27/2022 the pt got up to use the restroom became weak and slumped down to the floor with the support of her daughter.  Therefore, EMS was notified upon EMS arrival they attempted to pick her up and she became dazed and unresponsive.  In the ER the pt remained mildly confused and was found to be hypotensive, therefore she received multiple fluid boluses.  CXR revealed left lung infiltrate vs. atelectasis she was placed on 2L O2 via nasal canula and started on abx.  She was subsequently admitted to the telemetry unit for further workup and management by hospitalist.  She required transfer to [facility]  Unit following a rapid response due to metabolic encephalopathy requiring continuous Bipap.  Patient was noted to be in multiorgan failure therefore family was informed regarding the poor prognosis of the patient.  Patient was made a DNR and subsequently family decided to make her comfort measures only.  Patient was pronounced dead at 22:52 on 27-Jul-2016.    Pertinent Labs and Studies  Significant Diagnostic Studies Ct Abdomen Pelvis Wo Contrast  Result Date: Jul 26, 2016 CLINICAL DATA:  Diarrhea.  Syncope.  Altered mental status. EXAM: CT ABDOMEN AND PELVIS WITHOUT CONTRAST TECHNIQUE: Multidetector CT imaging of the abdomen and pelvis was performed following the standard protocol without IV contrast. COMPARISON:  Plain films of earlier today. FINDINGS: Mild degradation secondary to motion, EKG leads/ wires, and lack of oral or intravenous contrast. Lower chest: Left infrahilar volume loss. Moderate cardiomegaly, without pericardial effusion. Multivessel coronary artery atherosclerosis. Small hiatal hernia. Hepatobiliary: Normal liver. Cholecystectomy, without biliary ductal dilatation. Pancreas: Normal pancreas for age, with mild-to-moderate atrophy. Spleen: Normal in size, without focal abnormality. Adrenals/Urinary Tract: Normal adrenal glands. Mild renal cortical thinning bilaterally. No renal calculi or hydronephrosis. No hydroureter or ureteric calculi. No bladder calculi. Stomach/Bowel: Normal remainder of the stomach. Moderate amount of stool in the rectum. Wall thickening of and edema surrounding the sigmoid and rectum. Fluid-filled colon proximal to the rectum. The cecum is redundant, positioned in the left central pelvis. Normal terminal ileum. Appendix not visualized but no pericecal inflammation is seen. Normal small bowel. Vascular/Lymphatic: Advanced aortic and branch vessel atherosclerosis. Non aneurysmal infrarenal aortic dilatation at 2.5 cm. No  abdominopelvic adenopathy. Reproductive: Normal  uterus and adnexa. Other: No significant free fluid. A tiny fat containing right inguinal hernia. Musculoskeletal: Convex left lumbar spine curvature. Advanced lumbosacral spondylosis with grade 1 L5-S1 anterolisthesis IMPRESSION: 1. Inflammation involving the colon from the sigmoid distally. Considerations include infection (exclude C difficile clinically) and stercoral colitis (given moderate stool ball in the rectum). 2. Multifactorial degradation. 3.  Coronary artery atherosclerosis. Aortic atherosclerosis. 4. Small hiatal hernia. Electronically Signed   By: Jeronimo Greaves M.D.   On: 07/05/2016 08:43   Dg Chest 1 View  Result Date: 06/25/2016 CLINICAL DATA:  Short of breath EXAM: CHEST 1 VIEW COMPARISON:  12/23/2011 FINDINGS: Cardiac enlargement. Negative for heart failure or edema. Atherosclerotic aortic arch. Enlargement of the main pulmonary artery is well is a right and left pulmonary arteries compatible with pulmonary artery hypertension. There is underlying chronic lung disease. Bibasilar atelectasis/ scarring. Negative for pneumonia. IMPRESSION: COPD.  Pulmonary artery hypertension.  Atherosclerotic aorta Mild bibasilar atelectasis/ scarring. Electronically Signed   By: Marlan Palau M.D.   On: 06/25/2016 15:30   Dg Abd 1 View  Result Date: Jul 24, 2016 CLINICAL DATA:  Abdominal distention CT abdomen pelvis 06/21/2016 EXAM: ABDOMEN - 1 VIEW COMPARISON:  None. FINDINGS: Status post cholecystectomy. There is levoscoliosis of the lumbar spine with apex at L2. There is a paucity of bowel gas. No dilated bowel is visible. There is a right femoral approach central venous catheter with tip in the distal right common iliac vein. IMPRESSION: No radiographically evident acute abdominal abnormality. Electronically Signed   By: Deatra Robinson M.D.   On: 2016-07-24 06:27   Dg Abd 1 View  Result Date: 07/16/2016 CLINICAL DATA:  Generalized abdominal pain and constipation. EXAM: ABDOMEN - 1 VIEW COMPARISON:   None. FINDINGS: A small amount of gas is present in nondilated bowel loops in the lower abdomen. No dilated loops of bowel are seen to suggest obstruction. No gross intraperitoneal free air is identified on this supine study. Cholecystectomy clips, vascular calcifications, and S-shaped thoracolumbar scoliosis with disc degeneration are noted. Left lung base opacities are more fully evaluated on today's earlier chest radiograph. IMPRESSION: No evidence of bowel obstruction. Electronically Signed   By: Sebastian Ache M.D.   On: 07/11/2016 07:42   Ct Head Wo Contrast  Result Date: 07/12/2016 CLINICAL DATA:  Diarrhea, syncope, altered mentation.Generalized weakness. Confusion. EXAM: CT HEAD WITHOUT CONTRAST TECHNIQUE: Contiguous axial images were obtained from the base of the skull through the vertex without intravenous contrast. COMPARISON:  12/23/2011. FINDINGS: Brain: No evidence for acute infarction, hemorrhage, mass lesion, hydrocephalus, or extra-axial fluid. Mild atrophy, not unexpected for age. Hypoattenuation of white matter consistent with small vessel disease. Vascular: Advanced vascular calcification of the internal carotid arteries at the skullbase as well as the distal vertebral arteries. No signs of large vessel occlusion. Skull: Normal. Negative for fracture or focal lesion. Sinuses/Orbits: No acute finding. Other: None. IMPRESSION: Atrophy and small vessel disease, similar to priors. No acute intracranial findings. Electronically Signed   By: Elsie Stain M.D.   On: 06/30/2016 08:37   Dg Chest Port 1 View  Result Date: 07/24/16 CLINICAL DATA:  83 year old female status post central line placement. EXAM: PORTABLE CHEST 1 VIEW COMPARISON:  Chest radiograph dated 06/27/2016 FINDINGS: There has been interval placement of a right IJ central line which appears to extend into the right subclavian vein. Recommend retraction for removal of the line and repositioning. There is no pneumothorax. There is  cardiomegaly. There is atherosclerotic calcification  of the the aorta. There is prominence of the central pulmonary vasculature. Bibasilar densities similar to prior radiograph likely combination of small pleural effusions and associated atelectasis versus infiltrate. No acute osseous pathology. IMPRESSION: 1. Interval placement of a right IJ central line extending into the right subclavian vein. Recommend retraction or removal of the line and repositioning. No pneumothorax. 2. No interval change in the appearance of the lungs. These results were called by telephone at the time of interpretation on 07/11/2016 at 4:24 am to physician assistant [name] , who verbally acknowledged these results. Electronically Signed   By: Elgie Collard M.D.   On: 06/27/2016 04:28   Dg Chest Port 1 View  Result Date: Aug 12, 2016 CLINICAL DATA:  Sepsis and decreased mental status EXAM: PORTABLE CHEST 1 VIEW COMPARISON:  Chest radiograph Aug 12, 2016 at 5:02 a.m. FINDINGS: Unchanged cardiomegaly and calcific aortic atherosclerosis. There has been substantial worsening of aeration of both lungs likely due to pulmonary edema. Small bilateral pleural effusions. No pneumothorax. IMPRESSION: Cardiomegaly and aortic atherosclerosis with worsening aeration of both lung bases and small pleural effusions suggesting congestive heart failure. Electronically Signed   By: Deatra Robinson M.D.   On: 08/12/2016 22:17   Dg Chest Port 1 View  Result Date: Aug 12, 2016 CLINICAL DATA:  83 year old female with abdominal pain. The patient experienced loss of consciousness and fall. EXAM: PORTABLE CHEST 1 VIEW COMPARISON:  Chest radiograph dated 06/25/2016 FINDINGS: COPD changes. Left lung base opacity may represent scarring or combination of pleural effusion and associated atelectasis/ infiltrate. The right lung is clear. There is no pneumothorax. There is mild cardiomegaly. There is atherosclerotic calcification of the aortic arch. No acute osseous  pathology. IMPRESSION: Left lung base scarring versus small pleural effusion and associated atelectasis/ infiltrate. Stable mild cardiomegaly. Electronically Signed   By: Elgie Collard M.D.   On: 08/12/16 05:44   Dg Knee Complete 4 Views Left  Result Date: 06/25/2016 CLINICAL DATA:  BILATERAL knee replacement. Patient fell today. Pain. EXAM: LEFT KNEE - COMPLETE 4+ VIEW COMPARISON:  07/23/2009. FINDINGS: Unremarkable appearing total knee arthroplasty. Satisfactory femoral and tibial components. There is no definite effusion. Skeletal osteopenia is observed but there is no visible fracture. Vascular calcification is seen in the femoral and popliteal vessels. IMPRESSION: Unremarkable appearing LEFT TKR. Electronically Signed   By: Elsie Stain M.D.   On: 06/25/2016 15:31   Dg Knee Complete 4 Views Right  Result Date: 06/25/2016 CLINICAL DATA:  Pt presents to ED via [facility]. Per EMS pt was going up the stairs today when she became weak and fell to her knees, bruising to her R anterior knee. Hx bilateral knee replacements Pt has CHF, HTN.  Never a smoker EXAM: RIGHT KNEE - COMPLETE 4+ VIEW COMPARISON:  None. FINDINGS: No fracture. Knee prosthetic components appear well seated and aligned with no evidence of loosening. Bones are demineralized. No joint effusion. Vascular calcifications are noted posteriorly. IMPRESSION: 1. No fracture or dislocation. 2. No evidence of loosening of the orthopedic hardware. Electronically Signed   By: Amie Portland M.D.   On: 06/25/2016 17:23    Microbiology Recent Results (from the past 240 hour(s))  Culture, blood (routine x 2)     Status: None (Preliminary result)   Collection Time: 08/12/16  5:42 AM  Result Value Ref Range Status   Specimen Description BLOOD R AC  Final   Special Requests   Final    BOTTLES DRAWN AEROBIC AND ANAEROBIC Blood Culture adequate volume   Culture NO GROWTH  1 DAY  Final   Report Status PENDING  Incomplete  Culture, blood (routine x 2)      Status: None (Preliminary result)   Collection Time: 07/10/2016  5:42 AM  Result Value Ref Range Status   Specimen Description BLOOD L AC  Final   Special Requests   Final    BOTTLES DRAWN AEROBIC AND ANAEROBIC Blood Culture results may not be optimal due to an inadequate volume of blood received in culture bottles   Culture NO GROWTH 1 DAY  Final   Report Status PENDING  Incomplete  MRSA PCR Screening     Status: Abnormal   Collection Time: 07/20/2016 10:26 PM  Result Value Ref Range Status   MRSA by PCR POSITIVE (A) NEGATIVE Final    Comment:        The GeneXpert MRSA Assay (FDA approved for NASAL specimens only), is one component of a comprehensive MRSA colonization surveillance program. It is not intended to diagnose MRSA infection nor to guide or monitor treatment for MRSA infections. RESULT CALLED TO, READ BACK BY AND VERIFIED WITH: KENDRA SIMSER ON 07/04/2016 AT 0115 BY TLB   C difficile quick scan w PCR reflex     Status: None   Collection Time: 07/05/2016 10:26 PM  Result Value Ref Range Status   C Diff antigen NEGATIVE NEGATIVE Final   C Diff toxin NEGATIVE NEGATIVE Final   C Diff interpretation No C. difficile detected.  Final  Gastrointestinal Panel by PCR , Stool     Status: None   Collection Time: 07/09/2016 10:26 PM  Result Value Ref Range Status   Campylobacter species NOT DETECTED NOT DETECTED Final   Plesimonas shigelloides NOT DETECTED NOT DETECTED Final   Salmonella species NOT DETECTED NOT DETECTED Final   Yersinia enterocolitica NOT DETECTED NOT DETECTED Final   Vibrio species NOT DETECTED NOT DETECTED Final   Vibrio cholerae NOT DETECTED NOT DETECTED Final   Enteroaggregative E coli (EAEC) NOT DETECTED NOT DETECTED Final   Enteropathogenic E coli (EPEC) NOT DETECTED NOT DETECTED Final   Enterotoxigenic E coli (ETEC) NOT DETECTED NOT DETECTED Final   Shiga like toxin producing E coli (STEC) NOT DETECTED NOT DETECTED Final   Shigella/Enteroinvasive E coli  (EIEC) NOT DETECTED NOT DETECTED Final   Cryptosporidium NOT DETECTED NOT DETECTED Final   Cyclospora cayetanensis NOT DETECTED NOT DETECTED Final   Entamoeba histolytica NOT DETECTED NOT DETECTED Final   Giardia lamblia NOT DETECTED NOT DETECTED Final   Adenovirus F40/41 NOT DETECTED NOT DETECTED Final   Astrovirus NOT DETECTED NOT DETECTED Final   Norovirus GI/GII NOT DETECTED NOT DETECTED Final   Rotavirus A NOT DETECTED NOT DETECTED Final   Sapovirus (I, II, IV, and V) NOT DETECTED NOT DETECTED Final    Lab Basic Metabolic Panel:  Recent Labs Lab 07/20/2016 0433 07/06/2016 2127 18-Jul-2016 0458  NA 142 145 146*  K 3.5 4.6 3.6  CL 107 113* 109  CO2 22 16* 18*  GLUCOSE 169* 80 158*  BUN 43* 54* 58*  CREATININE 1.87* 2.66* 2.89*  CALCIUM 8.9 8.0* 6.9*   Liver Function Tests:  Recent Labs Lab 07/04/2016 0433 07/17/2016 2127  AST 36 294*  ALT 18 255*  ALKPHOS 148* 257*  BILITOT 1.0 1.1  PROT 7.0 6.1*  ALBUMIN 3.7 3.0*    Recent Labs Lab 07/16/2016 0433  LIPASE 17    Recent Labs Lab 2016/07/18 0453  AMMONIA 44*   CBC:  Recent Labs Lab 07/10/2016 0433 07/05/2016 2127 July 18, 2016  0458  WBC 24.8* 26.9* 20.2*  NEUTROABS  --   --  18.3*  HGB 13.8 12.7 11.7*  HCT 40.6 38.8 35.1  MCV 91.7 92.3 91.8  PLT 217 221 190   Cardiac Enzymes:  Recent Labs Lab 07-29-2016 0433 07/05/2016 0458 06/24/2016 1201  TROPONINI 0.05* 0.08* 0.12*   Sepsis Labs:  Recent Labs Lab 07-29-2016 0433 2016-07-29 0534 07/29/16 0734 2016/07/29 2127 07/12/2016 0458  PROCALCITON  --   --   --   --  101.03  WBC 24.8*  --   --  26.9* 20.2*  LATICACIDVEN  --  2.9* 2.2*  --  6.8*    Procedures/Operations  4/24 Central Venous Catheter>> 4/24 Right femoral Central line>>   Bincy S Varughese 07/15/2016, 11:52 PM

## 2016-07-21 NOTE — Progress Notes (Signed)
Patient removed from Bipap and placed on 2L [place]. Morphine drip started at . IV's removed. O2 probe, and BP cuff removed per family request.

## 2016-07-21 NOTE — Progress Notes (Signed)
Pharmacy Anticoagulation Note  83 y/o F ordered Lovenox for DVT propphylaxis.   Estimated Creatinine Clearance: 12.3 mL/min (A) (by C-G formula based on SCr of 2.89 mg/dL (H)).  Will convert patient to [facility] due to acute on chronic renal failure.   Luisa Hart, PharmD Clinical Pharmacist

## 2016-07-21 DEATH — deceased

## 2016-09-29 NOTE — Telephone Encounter (Signed)
NA

## 2018-02-23 IMAGING — DX DG KNEE COMPLETE 4+V*R*
4 series · 4 of 4 positions shown · non-contrast
Comparison: None.

CLINICAL DATA: Pt presents to ED via [facility]. Per EMS pt was going up
the stairs today when she became weak and fell to her knees,
bruising to her R anterior knee. Hx bilateral knee replacements
Pt has CHF, HTN.  Never a smoker

EXAM:
RIGHT KNEE - COMPLETE 4+ VIEW

[knee ap]
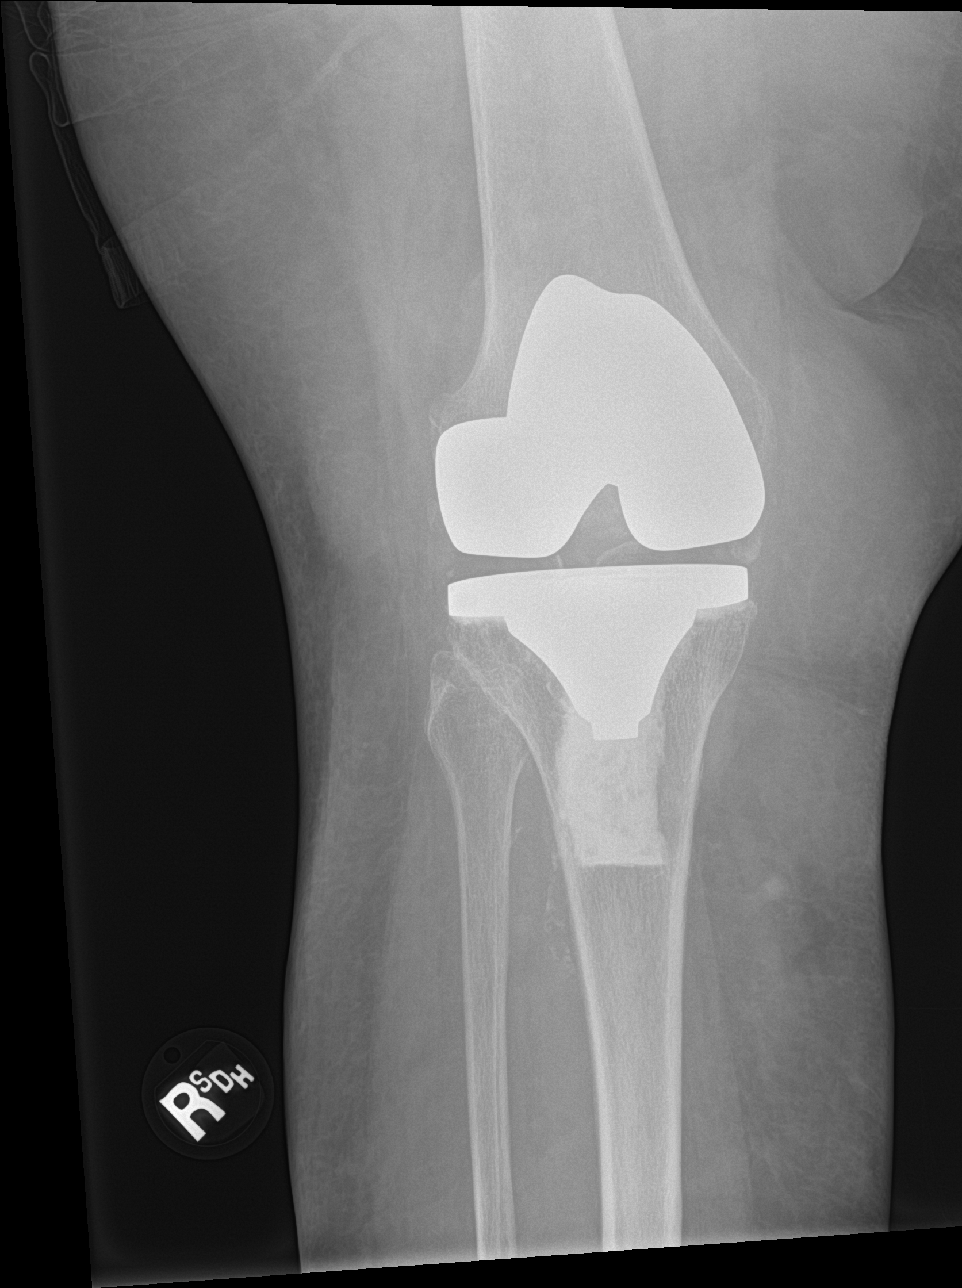

[knee tunnel]
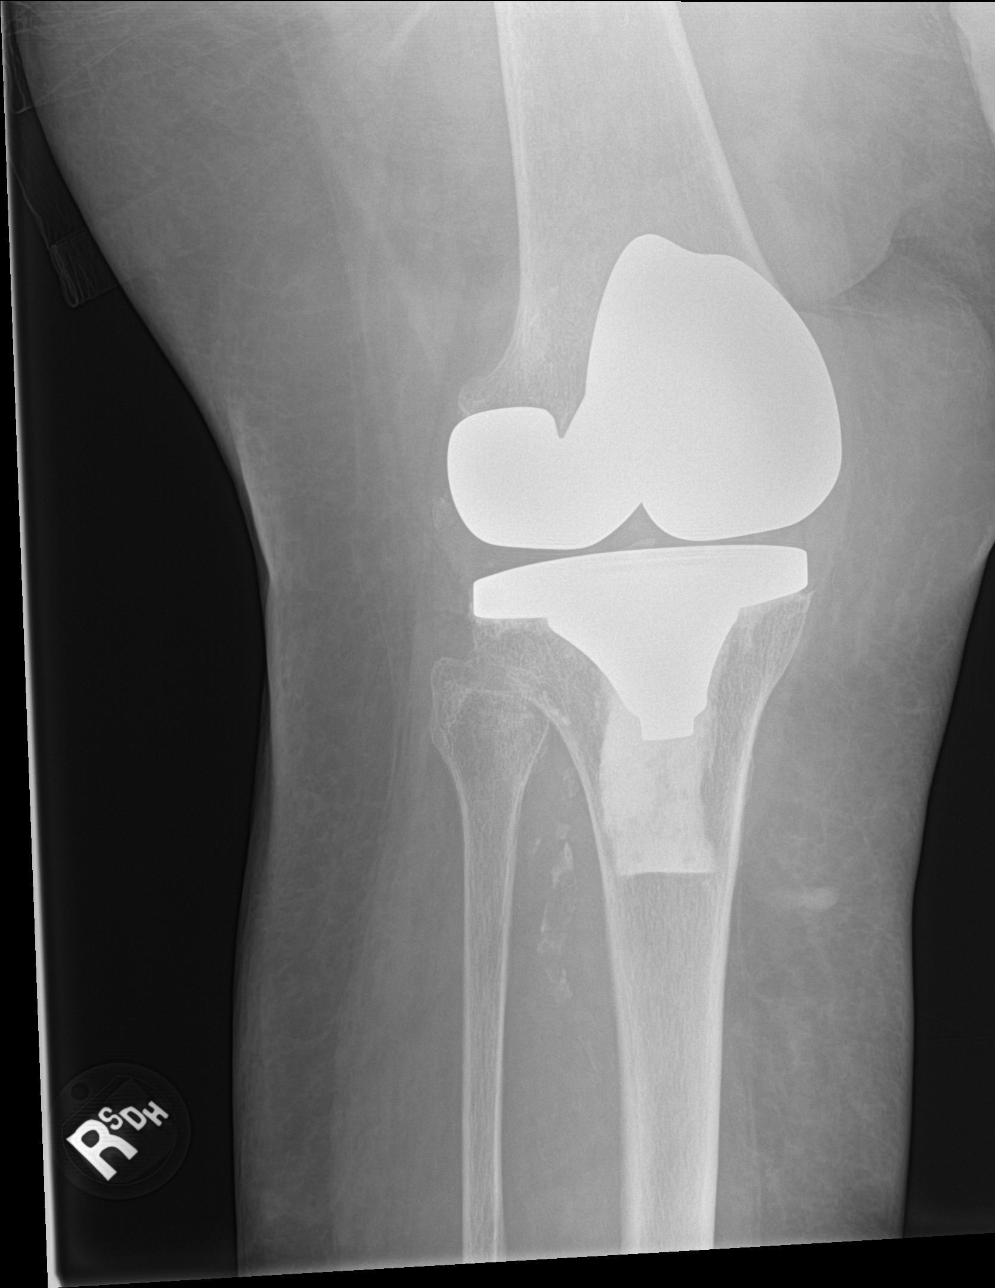

[knee lat]
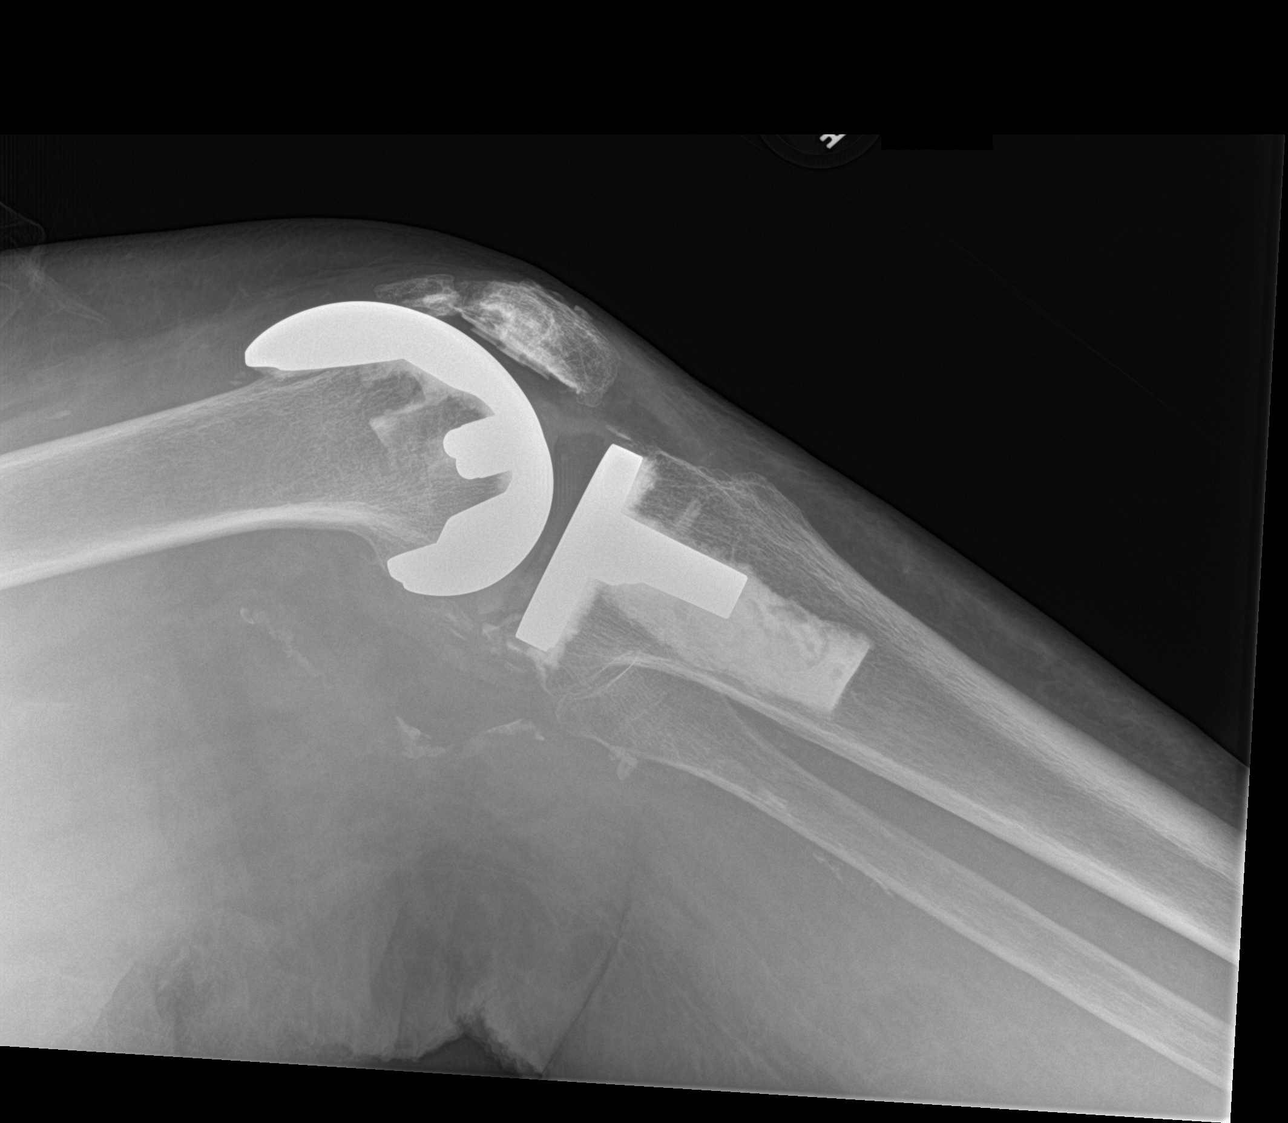

[knee obl]
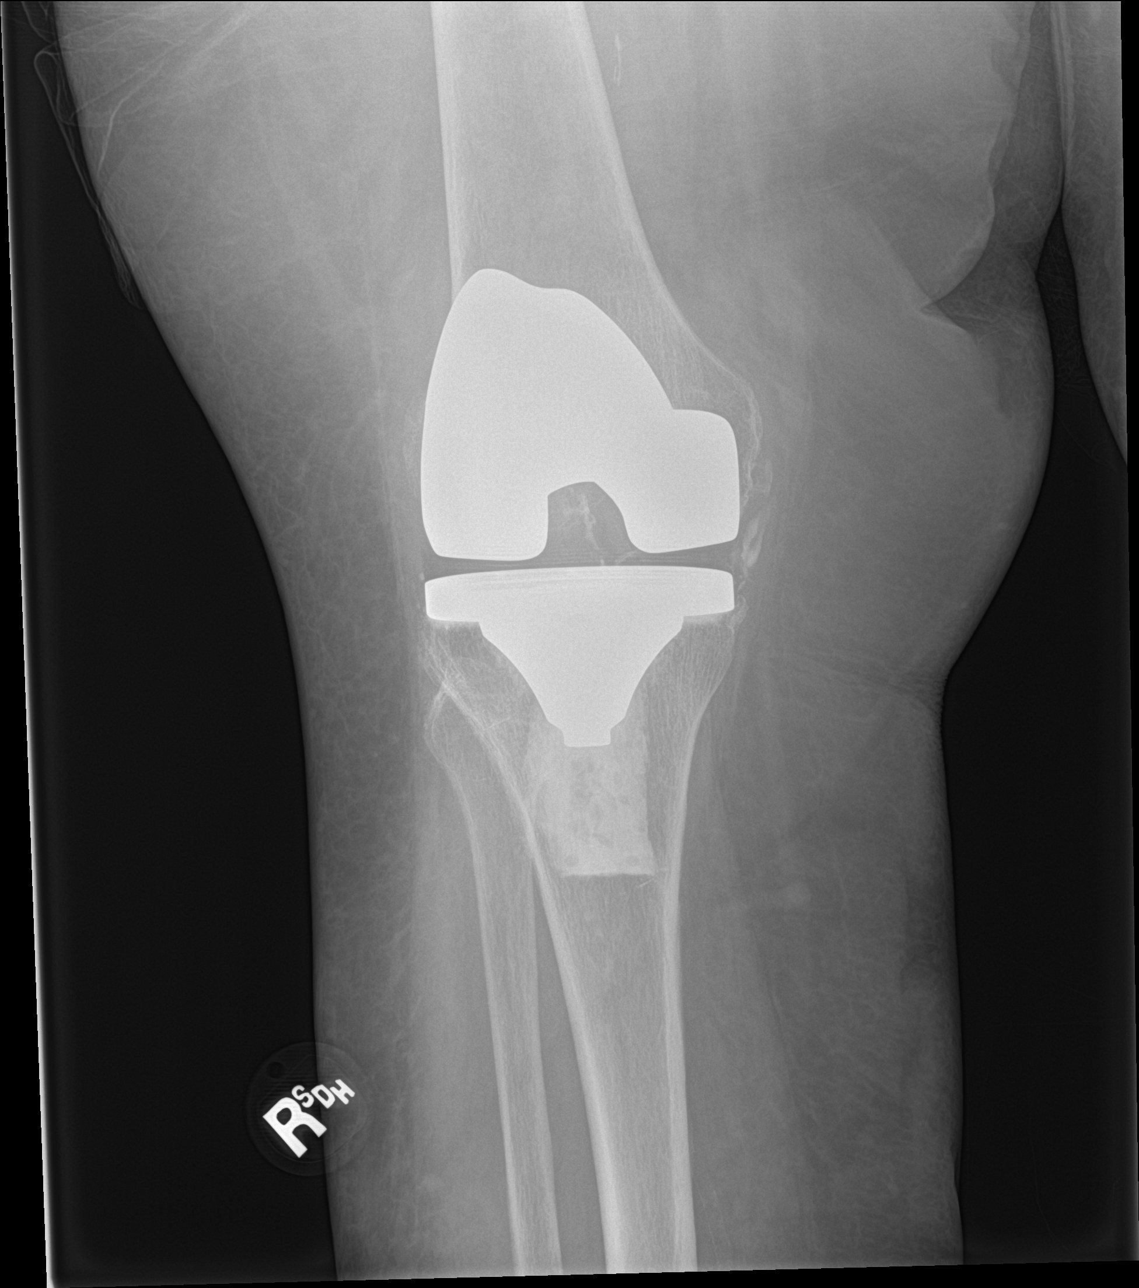

[4 of 4 positions shown; findings below may reference images not displayed]

FINDINGS: No fracture.

Knee prosthetic components appear well seated and aligned with no
evidence of loosening.

Bones are demineralized.

No joint effusion.

Vascular calcifications are noted posteriorly.
IMPRESSION: 1. No fracture or dislocation.
2. No evidence of loosening of the orthopedic hardware.

## 2018-02-23 IMAGING — DX DG KNEE COMPLETE 4+V*L*
4 series · 4 of 4 positions shown · non-contrast
Comparison: 07/23/2009.

CLINICAL DATA: BILATERAL knee replacement. Patient fell today.
Pain.

EXAM:
LEFT KNEE - COMPLETE 4+ VIEW

[knee ap]
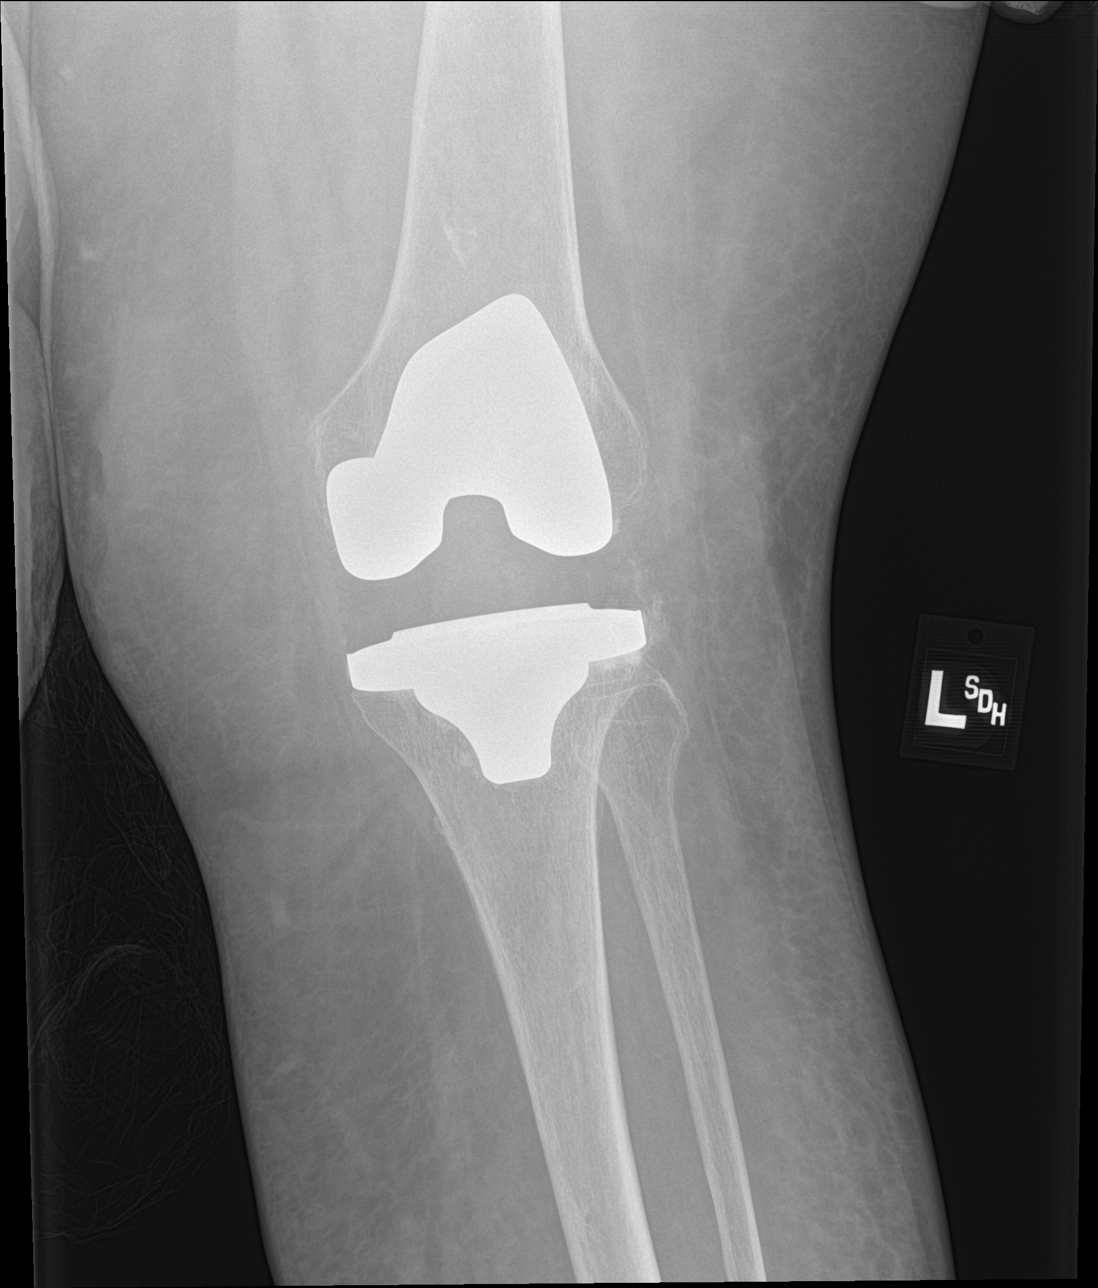

[knee lat]
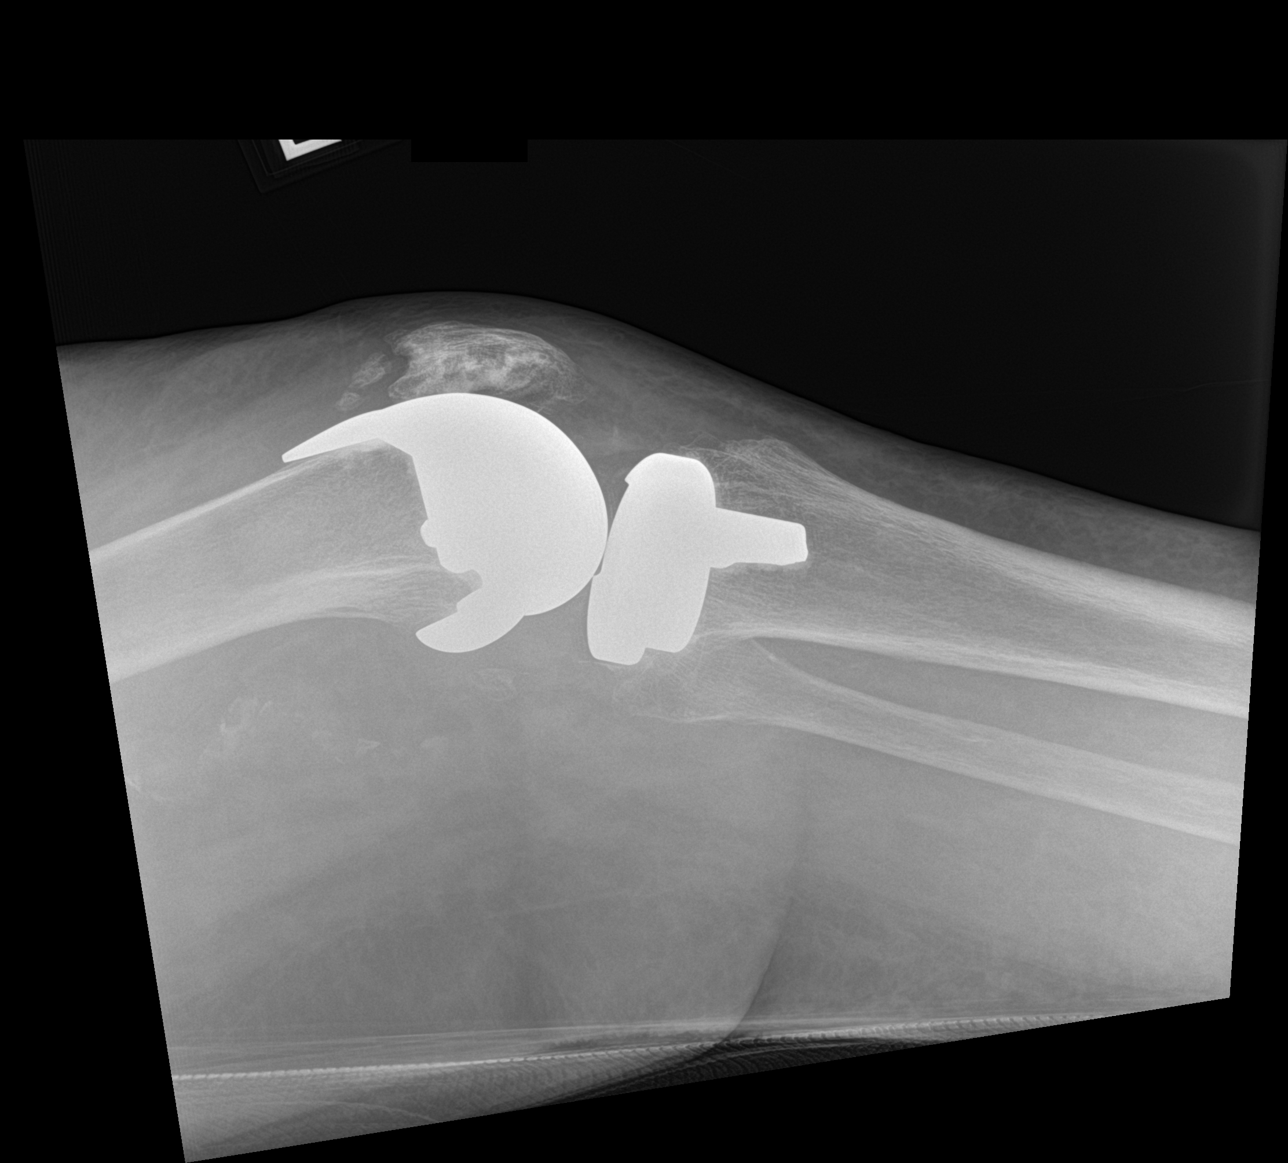

[knee obl (1 of 2)]
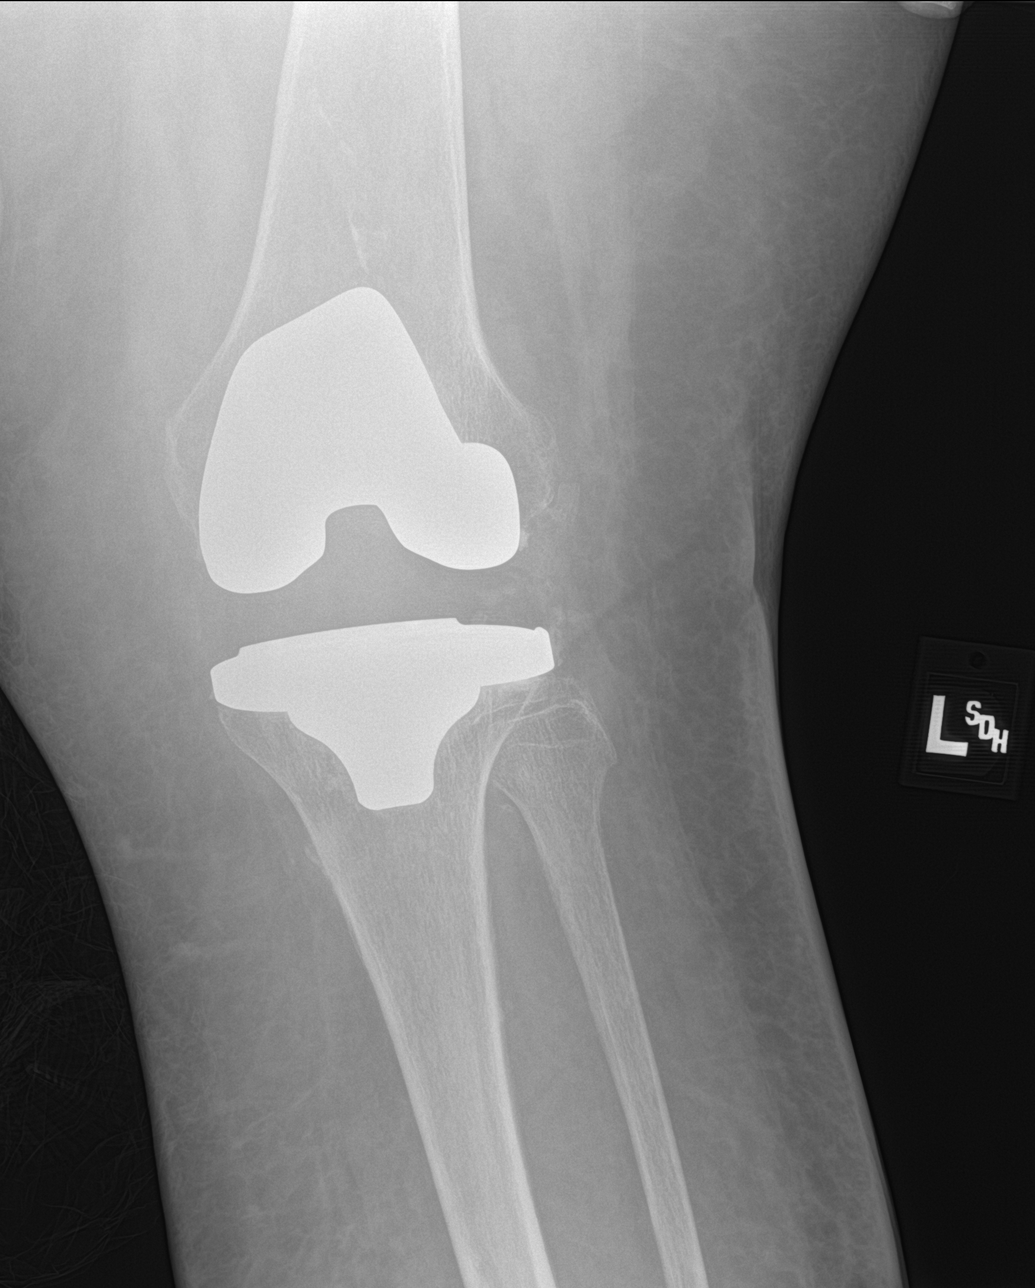

[knee obl (2 of 2)]
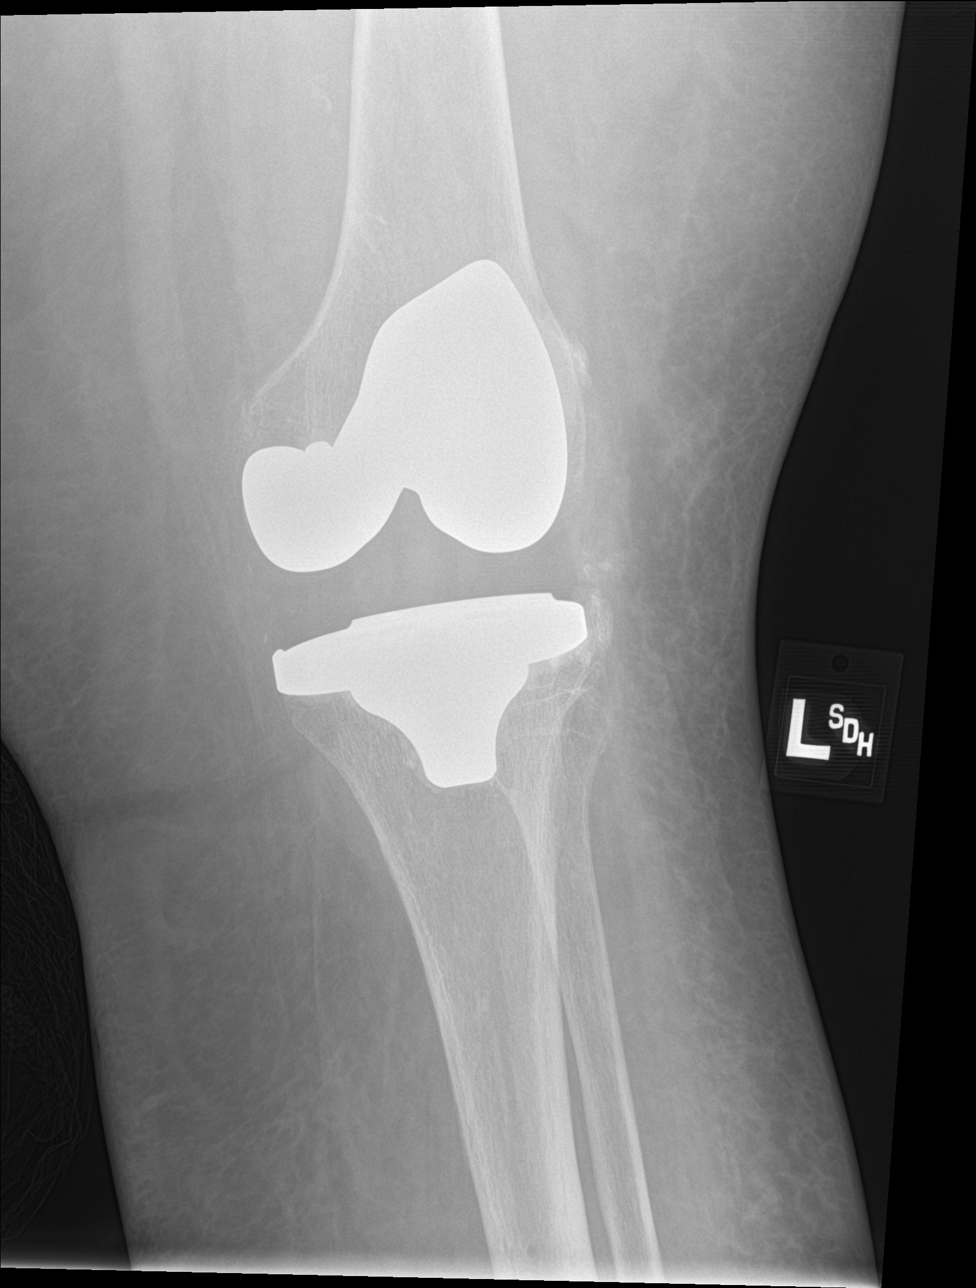

[4 of 4 positions shown; findings below may reference images not displayed]

FINDINGS: Unremarkable appearing total knee arthroplasty. Satisfactory femoral
and tibial components. There is no definite effusion. Skeletal
osteopenia is observed but there is no visible fracture. Vascular
calcification is seen in the femoral and popliteal vessels.
IMPRESSION: Unremarkable appearing LEFT TKR.

## 2018-03-14 IMAGING — DX DG CHEST 1V PORT
1 series · 1 of 1 positions shown · non-contrast
Comparison: Chest radiograph dated 07/13/2016

CLINICAL DATA: [AGE] female status post central line
placement.

EXAM:
PORTABLE CHEST 1 VIEW

[chest ap]
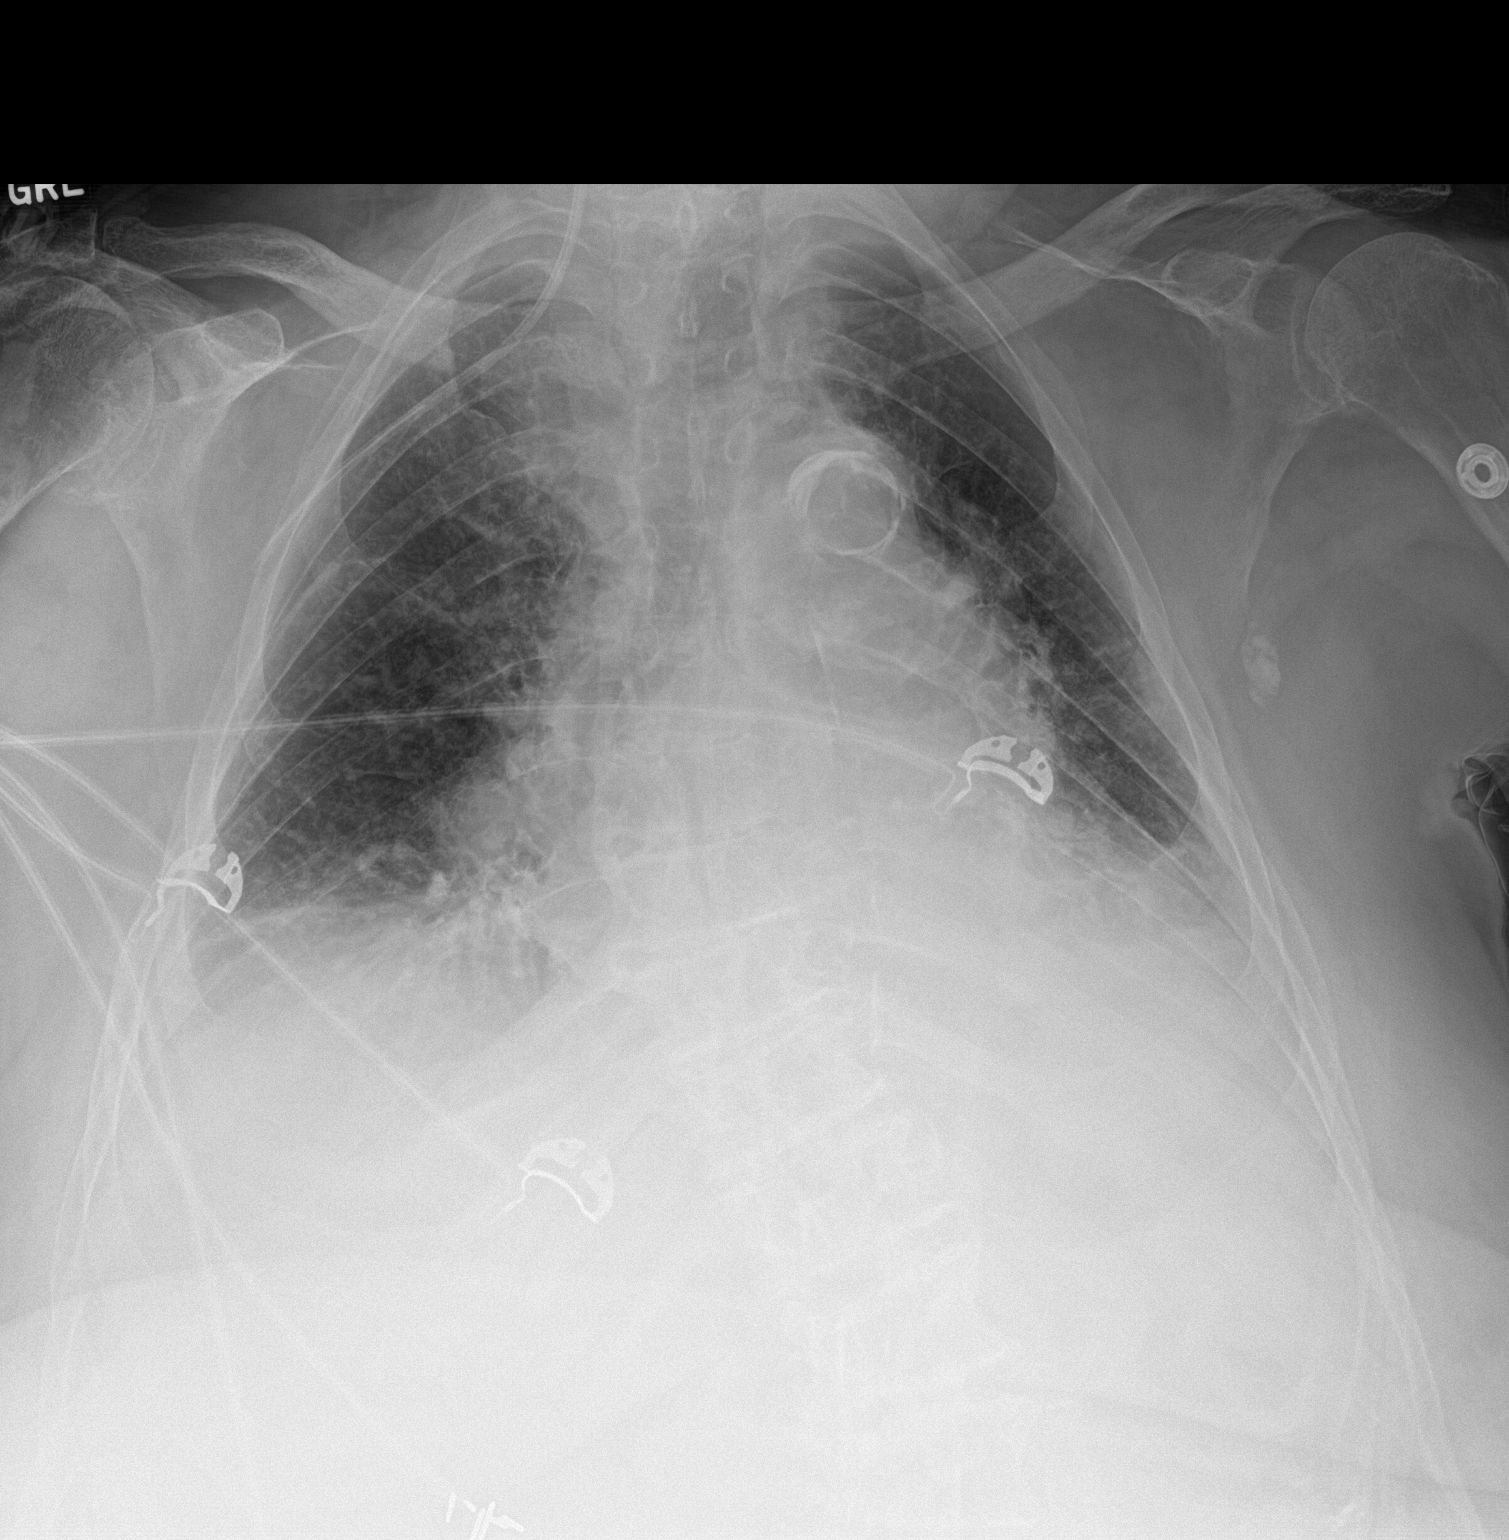

[1 of 1 positions shown; findings below may reference images not displayed]

FINDINGS: There has been interval placement of a right IJ central line which
appears to extend into the right subclavian vein. Recommend
retraction for removal of the line and repositioning. There is no
pneumothorax. There is cardiomegaly. There is atherosclerotic
calcification of the the aorta. There is prominence of the central
pulmonary vasculature. Bibasilar densities similar to prior
radiograph likely combination of small pleural effusions and
associated atelectasis versus infiltrate. No acute osseous
pathology.
IMPRESSION: 1. Interval placement of a right IJ central line extending into the
right subclavian vein. Recommend retraction or removal of the line
and repositioning. No pneumothorax.
2. No interval change in the appearance of the lungs.
These results were called by telephone at the time of interpretation
on 07/14/2016 at [DATE] to physician Adarsh Tiger , who
verbally acknowledged these results.
